# Patient Record
Sex: Male | Born: 1985 | Race: Black or African American | Hispanic: No | Marital: Single | Smoking: Current every day smoker
Health system: Southern US, Community
[De-identification: ages and names within clinical notes are randomized; demographics above are authoritative.]

## PROBLEM LIST (undated history)

## (undated) DIAGNOSIS — F1290 Cannabis use, unspecified, uncomplicated: Secondary | ICD-10-CM

## (undated) DIAGNOSIS — Z720 Tobacco use: Secondary | ICD-10-CM

---

## 2015-09-17 ENCOUNTER — Emergency Department
Admission: EM | Admit: 2015-09-17 | Discharge: 2015-09-17 | Disposition: A | Discharge status: Home / Self Care | Payer: Self-pay | Attending: Emergency Medicine | Admitting: Emergency Medicine

## 2015-09-17 ENCOUNTER — Emergency Department: Payer: Self-pay

## 2015-09-17 DIAGNOSIS — Y9389 Activity, other specified: Secondary | ICD-10-CM | POA: Insufficient documentation

## 2015-09-17 DIAGNOSIS — W228XXA Striking against or struck by other objects, initial encounter: Secondary | ICD-10-CM | POA: Insufficient documentation

## 2015-09-17 DIAGNOSIS — F17210 Nicotine dependence, cigarettes, uncomplicated: Secondary | ICD-10-CM | POA: Insufficient documentation

## 2015-09-17 DIAGNOSIS — Y9289 Other specified places as the place of occurrence of the external cause: Secondary | ICD-10-CM | POA: Insufficient documentation

## 2015-09-17 DIAGNOSIS — S60221A Contusion of right hand, initial encounter: Secondary | ICD-10-CM | POA: Insufficient documentation

## 2015-09-17 DIAGNOSIS — Y998 Other external cause status: Secondary | ICD-10-CM | POA: Insufficient documentation

## 2015-09-17 MED ORDER — NAPROXEN 500 MG PO TABS: 500.0000 mg | ORAL_TABLET | Freq: Two times a day (BID) | ORAL | Status: DC

## 2015-09-17 NOTE — Discharge Instructions (Signed)
Hand Contusion  A hand contusion is a deep bruise on your hand area. Contusions are the result of an injury that caused bleeding under the skin. The contusion may turn blue, purple, or yellow. Minor injuries will give you a painless contusion, but more severe contusions may stay painful and swollen for a few weeks.  CAUSES   A contusion is usually caused by a blow, trauma, or direct force to an area of the body.  SYMPTOMS    Swelling and redness of the injured area.   Discoloration of the injured area.   Tenderness and soreness of the injured area.   Pain.  DIAGNOSIS   The diagnosis can be made by taking a history and performing a physical exam. An X-ray, CT scan, or MRI may be needed to determine if there were any associated injuries, such as broken bones (fractures).  TREATMENT   Often, the best treatment for a hand contusion is resting, elevating, icing, and applying cold compresses to the injured area. Over-the-counter medicines may also be recommended for pain control.  HOME CARE INSTRUCTIONS    Put ice on the injured area.    Put ice in a plastic bag.    Place a towel between your skin and the bag.    Leave the ice on for 15-20 minutes, 03-04 times a day.   Only take over-the-counter or prescription medicines as directed by your caregiver. Your caregiver may recommend avoiding anti-inflammatory medicines (aspirin, ibuprofen, and naproxen) for 48 hours because these medicines may increase bruising.   If told, use an elastic wrap as directed. This can help reduce swelling. You may remove the wrap for sleeping, showering, and bathing. If your fingers become numb, cold, or blue, take the wrap off and reapply it more loosely.   Elevate your hand with pillows to reduce swelling.   Avoid overusing your hand if it is painful.  SEEK IMMEDIATE MEDICAL CARE IF:    You have increased redness, swelling, or pain in your hand.   Your swelling or pain is not relieved with medicines.   You have loss of feeling in  your hand or are unable to move your fingers.   Your hand turns cold or blue.   You have pain when you move your fingers.   Your hand becomes warm to the touch.   Your contusion does not improve in 2 days.  MAKE SURE YOU:    Understand these instructions.   Will watch your condition.   Will get help right away if you are not doing well or get worse.     This information is not intended to replace advice given to you by your health care provider. Make sure you discuss any questions you have with your health care provider.     Document Released: 02/04/2002 Document Revised: 05/09/2012 Document Reviewed: 02/06/2012  Elsevier Interactive Patient Education 2016 Elsevier [employer]

## 2015-09-17 NOTE — ED Notes (Signed)
Pt in xray
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
## 2015-09-17 NOTE — ED Notes (Signed)
Pt discharged home after verbalizing understanding of discharge instructions; nad noted. 
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
## 2015-10-26 ENCOUNTER — Emergency Department
Admission: EM | Admit: 2015-10-26 | Discharge: 2015-10-26 | Disposition: A | Discharge status: Home / Self Care | Payer: Self-pay | Attending: Emergency Medicine | Admitting: Emergency Medicine

## 2015-10-26 ENCOUNTER — Encounter: Payer: Self-pay | Admitting: Emergency Medicine

## 2015-10-26 DIAGNOSIS — S0592XA Unspecified injury of left eye and orbit, initial encounter: Secondary | ICD-10-CM | POA: Insufficient documentation

## 2015-10-26 DIAGNOSIS — Y9389 Activity, other specified: Secondary | ICD-10-CM | POA: Insufficient documentation

## 2015-10-26 DIAGNOSIS — Y998 Other external cause status: Secondary | ICD-10-CM | POA: Insufficient documentation

## 2015-10-26 DIAGNOSIS — H5712 Ocular pain, left eye: Secondary | ICD-10-CM

## 2015-10-26 DIAGNOSIS — Y9289 Other specified places as the place of occurrence of the external cause: Secondary | ICD-10-CM | POA: Insufficient documentation

## 2015-10-26 DIAGNOSIS — F17210 Nicotine dependence, cigarettes, uncomplicated: Secondary | ICD-10-CM | POA: Insufficient documentation

## 2015-10-26 DIAGNOSIS — Z791 Long term (current) use of non-steroidal anti-inflammatories (NSAID): Secondary | ICD-10-CM | POA: Insufficient documentation

## 2015-10-26 DIAGNOSIS — W2209XA Striking against other stationary object, initial encounter: Secondary | ICD-10-CM | POA: Insufficient documentation

## 2015-10-26 MED ORDER — FLUORESCEIN SODIUM 1 MG OP STRP
ORAL_STRIP | OPHTHALMIC | Status: AC
  Filled 2015-10-26: qty 1

## 2015-10-26 MED ORDER — TETRACAINE HCL 0.5 % OP SOLN
OPHTHALMIC | Status: AC
  Filled 2015-10-26: qty 2

## 2015-10-26 NOTE — Discharge Instructions (Signed)
Pain Without a Known Cause °WHAT IS PAIN WITHOUT A KNOWN CAUSE? °Pain can occur in any part of the body and can range from mild to severe. Sometimes no cause can be found for why you are having pain. Some types of pain that can occur without a known cause include:  °· Headache. °· Back pain. °· Abdominal pain. °· Neck pain. °HOW IS PAIN WITHOUT A KNOWN CAUSE DIAGNOSED?  °Your health care provider will try to find the cause of your pain. This may include: °· Physical exam. °· Medical history. °· Blood tests. °· Urine tests. °· X-rays. °If no cause is found, your health care provider may diagnose you with pain without a known cause.  °IS THERE TREATMENT FOR PAIN WITHOUT A CAUSE?  °Treatment depends on the kind of pain you have. Your health care provider may prescribe medicines to help relieve your pain.  °WHAT CAN I DO AT HOME FOR MY PAIN?  °· Take medicines only as directed by your health care provider. °· Stop any activities that cause pain. During periods of severe pain, bed rest may help. °· Try to reduce your stress with activities such as yoga or meditation. Talk to your health care provider for other stress-reducing activity recommendations. °· Exercise regularly, if approved by your health care provider. °· Eat a healthy diet that includes fruits and vegetables. This may improve pain. Talk to your health care provider if you have any questions about your diet. °WHAT IF MY PAIN DOES NOT GET BETTER?  °If you have a painful condition and no reason can be found for the pain or the pain gets worse, it is important to follow up with your health care provider. It may be necessary to repeat tests and look further for a possible cause.  °  °This information is not intended to replace advice given to you by your health care provider. Make sure you discuss any questions you have with your health care provider. °  °Document Released: 05/10/2001 Document Revised: 09/05/2014 Document Reviewed: 12/31/2013 °Elsevier  Interactive Patient Education ©2016 Elsevier Inc. ° °
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
## 2016-08-13 ENCOUNTER — Emergency Department
Admission: EM | Admit: 2016-08-13 | Discharge: 2016-08-13 | Disposition: A | Discharge status: Home / Self Care | Payer: Self-pay | Attending: Emergency Medicine | Admitting: Emergency Medicine

## 2016-08-13 DIAGNOSIS — J111 Influenza due to unidentified influenza virus with other respiratory manifestations: Secondary | ICD-10-CM

## 2016-08-13 DIAGNOSIS — F17210 Nicotine dependence, cigarettes, uncomplicated: Secondary | ICD-10-CM | POA: Insufficient documentation

## 2016-08-13 DIAGNOSIS — J09X2 Influenza due to identified novel influenza A virus with other respiratory manifestations: Secondary | ICD-10-CM | POA: Insufficient documentation

## 2016-08-13 DIAGNOSIS — Z791 Long term (current) use of non-steroidal anti-inflammatories (NSAID): Secondary | ICD-10-CM | POA: Insufficient documentation

## 2016-08-13 DIAGNOSIS — M545 Low back pain: Secondary | ICD-10-CM | POA: Insufficient documentation

## 2016-08-13 LAB — INFLUENZA PANEL BY PCR (TYPE A & B)
INFLAPCR: POSITIVE — AB
INFLBPCR: NEGATIVE

## 2016-08-13 MED ORDER — ACETAMINOPHEN 325 MG PO TABS
650.0000 mg | ORAL_TABLET | Freq: Once | ORAL | Status: AC
  Administered 2016-08-13: 650 mg via ORAL
  Filled 2016-08-13: qty 2

## 2016-08-13 MED ORDER — OSELTAMIVIR PHOSPHATE 75 MG PO CAPS
75.0000 mg | ORAL_CAPSULE | Freq: Two times a day (BID) | ORAL | 0 refills | Status: AC
Start: 2016-08-13 — End: 2016-08-23

## 2016-08-13 NOTE — ED Provider Notes (Signed)
[facility] Emergency Department Provider Note  ____________________________________________  Time seen: Approximately 1:55 PM  I have reviewed the triage vital signs and the nursing notes.   HISTORY  Chief Complaint Generalized Body Aches    HPI Charles Montes is a 30 y.o. male that presents to the emergency department with one day of fever, runny nose, body aches. Patient has not taken any medication. Patient denies cough, sore throat, shortness of breath, chest pain, abdominal pain, urinary symptoms.   History reviewed. No pertinent past medical history.  There are no active problems to display for this patient.   History reviewed. No pertinent surgical history.  Prior to Admission medications   Medication Sig Start Date End Date Taking? Authorizing Provider  naproxen (NAPROSYN) 500 MG tablet Take 1 tablet (500 mg total) by mouth 2 (two) times daily with a meal. 09/17/15   Delorise RoyalsJonathan D Cuthriell, PA-C  oseltamivir (TAMIFLU) 75 MG capsule Take 1 capsule (75 mg total) by mouth 2 (two) times daily. 08/13/16 08/23/16  Enid DerryAshley Earlee Herald, PA-C    Allergies Patient has no known allergies.  No family history on file.  Social History Social History  Substance Use Topics  . Smoking status: Current Every Day Smoker    Packs/day: 1.00    Types: Cigarettes  . Smokeless tobacco: Never Used  . Alcohol use Yes     Review of Systems  Eyes: No discharge. ENT: Negative for congestion and positive for rhinorrhea. Cardiovascular: No chest pain. Respiratory: Negative for cough. No SOB. Gastrointestinal: No abdominal pain.  No nausea, no vomiting.  No diarrhea.  No constipation. Musculoskeletal: Positive for musculoskeletal pain. Positive for low back pain. Skin: Negative for rash, abrasions, lacerations, ecchymosis. Neurological: Positive for headaches.   ____________________________________________   PHYSICAL EXAM:  VITAL SIGNS: ED Triage Vitals  [08/13/16 1328]  Enc Vitals Group     BP 134/68     Pulse Rate 95     Resp 20     Temp (!) 102 F (38.9 C)     Temp Source Oral     SpO2 100 %     Weight 180 lb (81.6 kg)     Height      Head Circumference      Peak Flow      Pain Score 8     Pain Loc      Pain Edu?      Excl. in GC?      Constitutional: Alert and oriented. Well appearing and in no acute distress. Eyes: Conjunctivae are normal. PERRL. EOMI. No discharge. Head: Atraumatic. ENT: No frontal and maxillary sinus tenderness.      Ears: Tympanic membranes pearly gray with good landmarks. No discharge.      Nose: No congestion/rhinnorhea.      Mouth/Throat: Mucous membranes are moist. Oropharynx non-erythematous. Tonsils not enlarged. No exudates. Uvula midline. Neck: No stridor.   Hematological/Lymphatic/Immunilogical: No cervical lymphadenopathy. Cardiovascular: Normal rate, regular rhythm. Normal S1 and S2.  Good peripheral circulation. Respiratory: Normal respiratory effort without tachypnea or retractions. Lungs CTAB. Good air entry to the bases with no decreased or absent breath sounds. Gastrointestinal: Bowel sounds 4 quadrants. Soft and nontender to palpation. No guarding or rigidity. No palpable masses. No distention. Musculoskeletal: Full range of motion to all extremities. No gross deformities appreciated. Neurologic:  Normal speech and language. No gross focal neurologic deficits are appreciated.  Skin:  Skin is warm, dry and intact. No rash noted. Psychiatric: Mood and affect are normal.  Speech and behavior are normal. Patient exhibits appropriate insight and judgement.   ____________________________________________   LABS (all labs ordered are listed, but only abnormal results are displayed)  Labs Reviewed  INFLUENZA PANEL BY PCR (TYPE A & B, H1N1) - Abnormal; Notable for the following:       Result Value   Influenza A By PCR POSITIVE (*)    All other components within normal limits    ____________________________________________  EKG   ____________________________________________  RADIOLOGY  No results found.  ____________________________________________    PROCEDURES  Procedure(s) performed:    Procedures    Medications  acetaminophen (TYLENOL) tablet 650 mg (650 mg Oral Given 08/13/16 1347)     ____________________________________________   INITIAL IMPRESSION / ASSESSMENT AND PLAN / ED COURSE  Pertinent labs & imaging results that were available during my care of the patient were reviewed by me and considered in my medical decision making (see chart for details).  Review of the [place] CSRS was performed in accordance of the [facility] prior to dispensing any controlled drugs.  Clinical Course     Patient's diagnosis is consistent with Influenza type A. Patient was given Tylenol in ED for fever. Fever came down before leaving ED. Patient will be discharged home with prescriptions for Tamiflu. Patient is to follow up with PCP as needed or otherwise directed. Patient is given ED precautions to return to the ED for any worsening or new symptoms.     ____________________________________________  FINAL CLINICAL IMPRESSION(S) / ED DIAGNOSES  Final diagnoses:  Influenza      NEW MEDICATIONS STARTED DURING THIS VISIT:  New Prescriptions   OSELTAMIVIR (TAMIFLU) 75 MG CAPSULE    Take 1 capsule (75 mg total) by mouth 2 (two) times daily.        This chart was dictated using voice recognition software/Dragon. Despite best efforts to proofread, errors can occur which can change the meaning. Any change was purely unintentional.    Enid DerryAshley Latroya Ng, PA-C 08/13/16 1858    Jeanmarie PlantJames A McShane, MD 08/15/16 [id number]

## 2016-08-13 NOTE — ED Triage Notes (Signed)
Pt reports to ED w/ c/o gen body aches, fever, runny nose and h/a.  Pt sts began last night, denies taking medication.  NAD.

## 2017-07-17 ENCOUNTER — Other Ambulatory Visit: Payer: Self-pay

## 2017-07-17 ENCOUNTER — Encounter: Payer: Self-pay | Admitting: Emergency Medicine

## 2017-07-17 ENCOUNTER — Emergency Department
Admission: EM | Admit: 2017-07-17 | Discharge: 2017-07-17 | Disposition: A | Discharge status: Home / Self Care | Payer: Self-pay | Attending: Emergency Medicine | Admitting: Emergency Medicine

## 2017-07-17 DIAGNOSIS — F17210 Nicotine dependence, cigarettes, uncomplicated: Secondary | ICD-10-CM | POA: Insufficient documentation

## 2017-07-17 DIAGNOSIS — J111 Influenza due to unidentified influenza virus with other respiratory manifestations: Secondary | ICD-10-CM | POA: Insufficient documentation

## 2017-07-17 LAB — INFLUENZA PANEL BY PCR (TYPE A & B)
INFLAPCR: NEGATIVE
INFLBPCR: NEGATIVE

## 2017-07-17 LAB — POCT RAPID STREP A: STREPTOCOCCUS, GROUP A SCREEN (DIRECT): NEGATIVE

## 2017-07-17 MED ORDER — KETOROLAC TROMETHAMINE 60 MG/2ML IM SOLN
60.0000 mg | Freq: Once | INTRAMUSCULAR | Status: AC
  Administered 2017-07-17: 60 mg via INTRAMUSCULAR
  Filled 2017-07-17: qty 2

## 2017-07-17 MED ORDER — OSELTAMIVIR PHOSPHATE 75 MG PO CAPS: 75.0000 mg | ORAL_CAPSULE | Freq: Two times a day (BID) | ORAL | 0 refills | Status: AC

## 2017-07-17 MED ORDER — PSEUDOEPH-BROMPHEN-DM 30-2-10 MG/5ML PO SYRP: 5.0000 mL | ORAL_SOLUTION | Freq: Four times a day (QID) | ORAL | 0 refills | Status: DC | PRN

## 2017-07-17 MED ORDER — IBUPROFEN 400 MG PO TABS
400.0000 mg | ORAL_TABLET | Freq: Once | ORAL | Status: AC
  Administered 2017-07-17: 400 mg via ORAL
  Filled 2017-07-17: qty 1

## 2017-07-17 MED ORDER — IBUPROFEN 600 MG PO TABS: 600.0000 mg | ORAL_TABLET | Freq: Four times a day (QID) | ORAL | 0 refills | Status: DC | PRN

## 2017-07-17 NOTE — ED Provider Notes (Addendum)
[facility] Emergency Department Provider Note   ____________________________________________   First MD Initiated Contact with Patient 07/17/17 1304     (approximate)  I have reviewed the triage vital signs and the nursing notes.   HISTORY  Chief Complaint Cough and Sore Throat    HPI Charles Montes is a 31 y.o. male patient presents with cough, sore throat, and body aches began last night. Patient is febrile in triage was given 2 Tylenols. Patient state he has not been around anyone else this been sick. Patient has not taken a flu shot this season.Patient denies nausea, vomiting, diarrhea. Patient rates his pain as a 9/10. Patient describes pain as "achy". No palliative measures until arrival in triage.   History reviewed. No pertinent past medical history.  There are no active problems to display for this patient.   History reviewed. No pertinent surgical history.  Prior to Admission medications   Medication Sig Start Date End Date Taking? Authorizing Provider  brompheniramine-pseudoephedrine-DM 30-2-10 MG/5ML syrup Take 5 mLs 4 (four) times daily as needed by mouth. 07/17/17   Joni ReiningSmith, Izayiah Tibbitts K, PA-C  ibuprofen (ADVIL,MOTRIN) 600 MG tablet Take 1 tablet (600 mg total) every 6 (six) hours as needed by mouth. 07/17/17   Joni ReiningSmith, Laverna Dossett K, PA-C  naproxen (NAPROSYN) 500 MG tablet Take 1 tablet (500 mg total) by mouth 2 (two) times daily with a meal. 09/17/15   Cuthriell, Delorise RoyalsJonathan D, PA-C  oseltamivir (TAMIFLU) 75 MG capsule Take 1 capsule (75 mg total) 2 (two) times daily for 5 days by mouth. 07/17/17 07/22/17  Joni ReiningSmith, Noha Karasik K, PA-C    Allergies Patient has no known allergies.  No family history on file.  Social History Social History   Tobacco Use  . Smoking status: Current Every Day Smoker    Packs/day: 1.00    Types: Cigarettes  . Smokeless tobacco: Never Used  Substance Use Topics  . Alcohol use: Yes  . Drug use: Not on file     Review of Systems Constitutional: Fever, chills, body aches  Eyes: No visual changes. ENT: Sore throat  Cardiovascular: Denies chest pain. Respiratory: Denies shortness of breath. Gastrointestinal: No abdominal pain.  No nausea, no vomiting.  No diarrhea.  No constipation. Genitourinary: Negative for dysuria. Musculoskeletal: Negative for back pain. Skin: Negative for rash. Neurological: Negative for headaches, focal weakness or numbness.   ____________________________________________   PHYSICAL EXAM:  VITAL SIGNS: ED Triage Vitals  Enc Vitals Group     BP 07/17/17 1222 128/90     Pulse Rate 07/17/17 1222 96     Resp 07/17/17 1222 18     Temp 07/17/17 1222 (!) 102 F (38.9 C)     Temp Source 07/17/17 1222 Oral     SpO2 07/17/17 1222 97 %     Weight 07/17/17 1222 180 lb (81.6 kg)     Height 07/17/17 1222 5\' 7"  (1.702 m)     Head Circumference --      Peak Flow --      Pain Score 07/17/17 1221 9     Pain Loc --      Pain Edu? --      Excl. in GC? --     Constitutional: Alert and oriented. Appears malaise Eyes: Conjunctivae are normal. PERRL. EOMI. Head: Atraumatic. Nose: Edematous nasal terms clear rhinorrhea Mouth/Throat: Mucous membranes are moist.  Oropharynx erythematous. Tonsil non-exudative or edematous. Postnasal drainage Neck: No stridor. No cervical spine tenderness to palpation. Cardiovascular: Normal rate, regular  rhythm. Grossly normal heart sounds.  Good peripheral circulation. Respiratory: Normal respiratory effort.  No retractions. Lungs CTAB. Gastrointestinal: Soft and nontender. No distention. No abdominal bruits. No CVA tenderness. Musculoskeletal: No lower extremity tenderness nor edema.  No joint effusions. Neurologic:  Normal speech and language. No gross focal neurologic deficits are appreciated. No gait instability. Skin:  Skin is warm, dry and intact. No rash noted. Psychiatric: Mood and affect are normal. Speech and behavior are  normal.  ____________________________________________   LABS (all labs ordered are listed, but only abnormal results are displayed)  Labs Reviewed  INFLUENZA PANEL BY PCR (TYPE A & B)  POCT RAPID STREP A   ____________________________________________  EKG   ____________________________________________  RADIOLOGY  No results found.  ____________________________________________   PROCEDURES  Procedure(s) performed: None  Procedures  Critical Care performed: No  ____________________________________________   INITIAL IMPRESSION / ASSESSMENT AND PLAN / ED COURSE  As part of my medical decision making, I reviewed the following data within the electronic MEDICAL RECORD NUMBER    Patient presented with acute onset of cough, sore throat, body aches, fever and chills. Patient complaint consist for flulike illness. Patient given discharge care instruction past take medication as directed.    ____________________________________________   FINAL CLINICAL IMPRESSION(S) / ED DIAGNOSES  Final diagnoses:  Influenza A     ED Discharge Orders        Ordered    oseltamivir (TAMIFLU) 75 MG capsule  2 times daily     07/17/17 1406    ibuprofen (ADVIL,MOTRIN) 600 MG tablet  Every 6 hours PRN     07/17/17 1406    brompheniramine-pseudoephedrine-DM 30-2-10 MG/5ML syrup  4 times daily PRN     07/17/17 1406       Note:  This document was prepared using Dragon voice recognition software and may include unintentional dictation errors.    Joni ReiningSmith, Krzysztof Reichelt K, PA-C 07/17/17 1410    Emily FilbertWilliams, Jonathan E, MD 07/17/17 1415    Joni ReiningSmith, Alene Bergerson K, PA-C 07/17/17 1421    Emily FilbertWilliams, Jonathan E, MD 07/17/17 [phone]

## 2017-07-17 NOTE — ED Triage Notes (Signed)
Cough and sore throat since last night, pt sweating in triage, took 2 tylenol this am.

## 2017-07-17 NOTE — ED Notes (Signed)
E-signature box not working. Pt verbalized understanding of discharge instructions and denied questions. 

## 2017-07-19 ENCOUNTER — Emergency Department
Admission: EM | Admit: 2017-07-19 | Discharge: 2017-07-19 | Disposition: A | Discharge status: Home / Self Care | Payer: Self-pay | Attending: Emergency Medicine | Admitting: Emergency Medicine

## 2017-07-19 ENCOUNTER — Encounter: Payer: Self-pay | Admitting: Emergency Medicine

## 2017-07-19 DIAGNOSIS — Z79899 Other long term (current) drug therapy: Secondary | ICD-10-CM | POA: Insufficient documentation

## 2017-07-19 DIAGNOSIS — F17210 Nicotine dependence, cigarettes, uncomplicated: Secondary | ICD-10-CM | POA: Insufficient documentation

## 2017-07-19 DIAGNOSIS — J039 Acute tonsillitis, unspecified: Secondary | ICD-10-CM | POA: Insufficient documentation

## 2017-07-19 LAB — CBC WITH DIFFERENTIAL/PLATELET
BASOS PCT: 0 %
Basophils Absolute: 0 10*3/uL (ref 0–0.1)
Eosinophils Absolute: 0.1 10*3/uL (ref 0–0.7)
Eosinophils Relative: 1 %
HEMATOCRIT: 45.8 % (ref 40.0–52.0)
HEMOGLOBIN: 15 g/dL (ref 13.0–18.0)
LYMPHS PCT: 11 %
Lymphs Abs: 1.6 10*3/uL (ref 1.0–3.6)
MCH: 31.4 pg (ref 26.0–34.0)
MCHC: 32.7 g/dL (ref 32.0–36.0)
MCV: 95.9 fL (ref 80.0–100.0)
MONO ABS: 2.5 10*3/uL — AB (ref 0.2–1.0)
Monocytes Relative: 17 %
NEUTROS PCT: 71 %
Neutro Abs: 10.3 10*3/uL — ABNORMAL HIGH (ref 1.4–6.5)
Platelets: 220 10*3/uL (ref 150–440)
RBC: 4.77 MIL/uL (ref 4.40–5.90)
RDW: 13.3 % (ref 11.5–14.5)
WBC: 14.5 10*3/uL — ABNORMAL HIGH (ref 3.8–10.6)

## 2017-07-19 LAB — BASIC METABOLIC PANEL
Anion gap: 13 (ref 5–15)
BUN: 11 mg/dL (ref 6–20)
CHLORIDE: 99 mmol/L — AB (ref 101–111)
CO2: 25 mmol/L (ref 22–32)
CREATININE: 1.47 mg/dL — AB (ref 0.61–1.24)
Calcium: 9.3 mg/dL (ref 8.9–10.3)
GFR calc non Af Amer: >60 mL/min (ref >60)
GLUCOSE: 101 mg/dL — AB (ref 65–99)
Potassium: 4.1 mmol/L (ref 3.5–5.1)
Sodium: 137 mmol/L (ref 135–145)

## 2017-07-19 LAB — POCT RAPID STREP A: Streptococcus, Group A Screen (Direct): NEGATIVE

## 2017-07-19 MED ORDER — SODIUM CHLORIDE 0.9 % IV BOLUS (SEPSIS)
1000.0000 mL | Freq: Once | INTRAVENOUS | Status: AC
  Administered 2017-07-19: 1000 mL via INTRAVENOUS

## 2017-07-19 MED ORDER — AMOXICILLIN-POT CLAVULANATE 250-62.5 MG/5ML PO SUSR: 875.0000 mg | Freq: Two times a day (BID) | ORAL | 0 refills | Status: AC

## 2017-07-19 MED ORDER — PREDNISOLONE SODIUM PHOSPHATE 15 MG/5ML PO SOLN: 50.0000 mg | Freq: Every day | ORAL | 0 refills | Status: AC

## 2017-07-19 MED ORDER — HYDROCODONE-ACETAMINOPHEN 7.5-325 MG/15ML PO SOLN: 15.0000 mL | Freq: Four times a day (QID) | ORAL | 0 refills | Status: AC | PRN

## 2017-07-19 MED ORDER — AMPICILLIN-SULBACTAM SODIUM 3 (2-1) G IJ SOLR
3.0000 g | Freq: Once | INTRAMUSCULAR | Status: AC
  Administered 2017-07-19: 3 g via INTRAVENOUS
  Filled 2017-07-19: qty 3

## 2017-07-19 MED ORDER — LIDOCAINE VISCOUS 2 % MT SOLN
15.0000 mL | Freq: Once | OROMUCOSAL | Status: AC
  Administered 2017-07-19: 15 mL via OROMUCOSAL
  Filled 2017-07-19: qty 15

## 2017-07-19 MED ORDER — METHYLPREDNISOLONE SODIUM SUCC 125 MG IJ SOLR
125.0000 mg | Freq: Once | INTRAMUSCULAR | Status: AC
  Administered 2017-07-19: 125 mg via INTRAVENOUS
  Filled 2017-07-19: qty 2

## 2017-07-19 NOTE — Discharge Instructions (Signed)
Return to the ER for symptoms that worsen. Follow up with the ENT specialist. Make sure you take all of the antibiotic and steroid medications.  Take your antibiotic at 6pm this evening

## 2017-07-19 NOTE — ED Triage Notes (Signed)
Pt reports just had the flu and now has severe throat pain. Pt unable to speak clearly, reports pain for the past 3 days. Pt also reports some nasal congestion as well but reports his throat is so painful he is having difficult eating and drinking.

## 2017-07-19 NOTE — ED Provider Notes (Signed)
[facility] Emergency Department Provider Note  ____________________________________________  Time seen: Approximately 10:36 AM  I have reviewed the triage vital signs and the nursing notes.   HISTORY  Chief Complaint Sore Throat    HPI Charles Montes is a 31 y.o. male who presents to the emergency department for treatment and evaluation of sore throat.  He was evaluated here 2 days ago and given prescriptions for Tamiflu, ibuprofen, and Bromfed.  He states that over the past 3 days his throat has began to hurt much worse and he is now having difficulty eating, drinking, and swallowing due to the pain.  He has had no relief from the above medications.  He has no known drug allergies, he has no known chronic health issues and other than the previous prescriptions from 2 days ago, he takes no daily medications.  Positive History reviewed. No pertinent past medical history.  There are no active problems to display for this patient.   History reviewed. No pertinent surgical history.  Prior to Admission medications   Medication Sig Start Date End Date Taking? Authorizing Provider  amoxicillin-clavulanate (AUGMENTIN) 250-62.5 MG/5ML suspension Take 17.5 mLs (875 mg total) by mouth 2 (two) times daily for 10 days. 07/19/17 07/29/17  Chinita Pesterriplett, Demyah Smyre B, FNP  brompheniramine-pseudoephedrine-DM 30-2-10 MG/5ML syrup Take 5 mLs 4 (four) times daily as needed by mouth. 07/17/17   Joni ReiningSmith, Ronald K, PA-C  HYDROcodone-acetaminophen (HYCET) 7.5-325 mg/15 ml solution Take 15 mLs by mouth 4 (four) times daily as needed for moderate pain. 07/19/17 07/19/18  Arrian Manson, Kasandra Knudsenari B, FNP  ibuprofen (ADVIL,MOTRIN) 600 MG tablet Take 1 tablet (600 mg total) every 6 (six) hours as needed by mouth. 07/17/17   Joni ReiningSmith, Ronald K, PA-C  naproxen (NAPROSYN) 500 MG tablet Take 1 tablet (500 mg total) by mouth 2 (two) times daily with a meal. 09/17/15   Cuthriell, Delorise RoyalsJonathan D, PA-C  oseltamivir  (TAMIFLU) 75 MG capsule Take 1 capsule (75 mg total) 2 (two) times daily for 5 days by mouth. 07/17/17 07/22/17  Joni ReiningSmith, Ronald K, PA-C  prednisoLONE (ORAPRED) 15 MG/5ML solution Take 16.7 mLs (50 mg total) by mouth daily for 5 days. 07/19/17 07/24/17  Chinita Pesterriplett, Glori Machnik B, FNP    Allergies Patient has no known allergies.  No family history on file.  Social History Social History   Tobacco Use  . Smoking status: Current Every Day Smoker    Packs/day: 1.00    Types: Cigarettes  . Smokeless tobacco: Never Used  Substance Use Topics  . Alcohol use: Yes  . Drug use: Not on file    Review of Systems Constitutional: Positive for fever. Eyes: No visual changes. ENT: Positive for sore throat; positive for difficulty swallowing. Respiratory: Denies shortness of breath. Gastrointestinal: No abdominal pain.  No nausea, no vomiting.  No diarrhea.  Genitourinary: Negative for dysuria. Musculoskeletal: Positive for generalized body aches. Skin: Negative for rash. Neurological: Negative for headaches, negative focal weakness or numbness.  ____________________________________________   PHYSICAL EXAM:  VITAL SIGNS: ED Triage Vitals  Enc Vitals Group     BP 07/19/17 0956 (!) 150/102     Pulse Rate 07/19/17 0956 100     Resp 07/19/17 0956 20     Temp 07/19/17 0956 99.7 F (37.6 C)     Temp src --      SpO2 07/19/17 0956 100 %     Weight 07/19/17 0956 180 lb (81.6 kg)     Height 07/19/17 0956 5\' 7"  (1.702 m)  Head Circumference --      Peak Flow --      Pain Score 07/19/17 0955 10     Pain Loc --      Pain Edu? --      Excl. in GC? --    Constitutional: Alert and oriented. Well appearing and in no acute distress. Eyes: Conjunctivae are normal.  Head: Atraumatic. Nose: No congestion/rhinnorhea. Mouth/Throat: Mucous membranes are moist.  Oropharynx erythematous, tonsils 3+ with exudate. Uvula is midline. Neck: No stridor.  Lymphatic: Lymphadenopathy: Bilateral anterior cervical  lymphadenopathy palpable Cardiovascular: Tachycardic, regular rhythm. Good peripheral circulation. Respiratory: Normal respiratory effort. Lungs CTAB. Gastrointestinal: Soft and nontender. Musculoskeletal: No lower extremity tenderness nor edema.  Neurologic:  Normal speech and language. No gross focal neurologic deficits are appreciated. Speech is normal. No gait instability. Skin:  Skin is warm, dry and intact. No rash noted Psychiatric: Mood and affect are normal. Speech and behavior are normal.  ____________________________________________   LABS (all labs ordered are listed, but only abnormal results are displayed)  Labs Reviewed  BASIC METABOLIC PANEL - Abnormal; Notable for the following components:      Result Value   Chloride 99 (*)    Glucose, Bld 101 (*)    Creatinine, Ser 1.47 (*)    All other components within normal limits  CBC WITH DIFFERENTIAL/PLATELET - Abnormal; Notable for the following components:   WBC 14.5 (*)    Neutro Abs 10.3 (*)    Monocytes Absolute 2.5 (*)    All other components within normal limits  POCT RAPID STREP A   ____________________________________________  EKG  Not indicated ____________________________________________  RADIOLOGY  Not indicated ____________________________________________   PROCEDURES  Procedure(s) performed: None  Critical Care performed: No ____________________________________________   INITIAL IMPRESSION / ASSESSMENT AND PLAN / ED COURSE  31 year old male presenting to the emergency department for treatment and evaluation of sore throat.  Voice is slightly muffled, but there is no swelling of the tongue and uvula is midline.  He will be given Solu-Medrol, Unasyn, and IV fluids.  Labs have also been ordered.  This is most likely a tonsillitis and not peritonsillar abscess based on exam.  Imaging is not indicated at this time.  ----------------------------------------- 12:49 PM on  07/19/2017 -----------------------------------------  After IV fluids and Solu-Medrol, patient was able to speak much more clearly and swallow without difficulty.  Home care instructions were discussed with him and his significant other.  He was advised to follow-up with ENT if not improving over the weekend.  He was given strict ER return precautions as well.  Pertinent labs & imaging results that were available during my care of the patient were reviewed by me and considered in my medical decision making (see chart for details). ____________________________________________  FINAL CLINICAL IMPRESSION(S) / ED DIAGNOSES  Final diagnoses:  Tonsillitis with exudate    If controlled substance prescribed during this visit, 12 month history viewed on the [facility] prior to issuing an initial prescription for Schedule II or III opiod.   Note:  This document was prepared using Dragon voice recognition software and may include unintentional dictation errors.    Chinita Pesterriplett, Orhan Mayorga B, FNP 07/19/17 1333    Nita SickleVeronese, [place], MD 07/19/17 [phone]

## 2019-07-23 ENCOUNTER — Emergency Department: Payer: No Typology Code available for payment source

## 2019-07-23 ENCOUNTER — Other Ambulatory Visit: Payer: Self-pay

## 2019-07-23 ENCOUNTER — Emergency Department
Admission: EM | Admit: 2019-07-23 | Discharge: 2019-07-23 | Disposition: A | Discharge status: Home / Self Care | Payer: No Typology Code available for payment source | Attending: Emergency Medicine | Admitting: Emergency Medicine

## 2019-07-23 ENCOUNTER — Encounter: Payer: Self-pay | Admitting: Emergency Medicine

## 2019-07-23 DIAGNOSIS — M25511 Pain in right shoulder: Secondary | ICD-10-CM | POA: Insufficient documentation

## 2019-07-23 DIAGNOSIS — F17210 Nicotine dependence, cigarettes, uncomplicated: Secondary | ICD-10-CM | POA: Diagnosis not present

## 2019-07-23 DIAGNOSIS — M545 Low back pain: Secondary | ICD-10-CM | POA: Diagnosis not present

## 2019-07-23 DIAGNOSIS — M791 Myalgia, unspecified site: Secondary | ICD-10-CM | POA: Insufficient documentation

## 2019-07-23 DIAGNOSIS — R519 Headache, unspecified: Secondary | ICD-10-CM | POA: Diagnosis present

## 2019-07-23 DIAGNOSIS — M7918 Myalgia, other site: Secondary | ICD-10-CM

## 2019-07-23 MED ORDER — IBUPROFEN 600 MG PO TABS
600.0000 mg | ORAL_TABLET | Freq: Once | ORAL | Status: AC
  Administered 2019-07-23: 600 mg via ORAL
  Filled 2019-07-23: qty 1

## 2019-07-23 MED ORDER — CYCLOBENZAPRINE HCL 10 MG PO TABS
10.0000 mg | ORAL_TABLET | Freq: Once | ORAL | Status: AC
  Administered 2019-07-23: 10 mg via ORAL
  Filled 2019-07-23: qty 1

## 2019-07-23 MED ORDER — CYCLOBENZAPRINE HCL 10 MG PO TABS: 10.0000 mg | ORAL_TABLET | Freq: Three times a day (TID) | ORAL | 0 refills | Status: DC | PRN

## 2019-07-23 MED ORDER — TRAMADOL HCL 50 MG PO TABS: 50.0000 mg | ORAL_TABLET | Freq: Four times a day (QID) | ORAL | 0 refills | Status: AC | PRN

## 2019-07-23 MED ORDER — IBUPROFEN 600 MG PO TABS: 600.0000 mg | ORAL_TABLET | Freq: Three times a day (TID) | ORAL | 0 refills | Status: DC | PRN

## 2019-07-23 MED ORDER — TRAMADOL HCL 50 MG PO TABS
50.0000 mg | ORAL_TABLET | Freq: Once | ORAL | Status: AC
  Administered 2019-07-23: 14:00:00 50 mg via ORAL
  Filled 2019-07-23: qty 1

## 2019-07-23 NOTE — Discharge Instructions (Signed)
Follow discharge care instructions take medication as directed. °
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
## 2019-07-27 ENCOUNTER — Other Ambulatory Visit: Payer: Self-pay

## 2019-07-27 ENCOUNTER — Encounter: Payer: Self-pay | Admitting: Emergency Medicine

## 2019-07-27 ENCOUNTER — Emergency Department
Admission: EM | Admit: 2019-07-27 | Discharge: 2019-07-27 | Disposition: A | Discharge status: Home / Self Care | Payer: No Typology Code available for payment source | Attending: Student | Admitting: Student

## 2019-07-27 ENCOUNTER — Emergency Department: Payer: No Typology Code available for payment source

## 2019-07-27 DIAGNOSIS — M5442 Lumbago with sciatica, left side: Secondary | ICD-10-CM | POA: Insufficient documentation

## 2019-07-27 DIAGNOSIS — F17210 Nicotine dependence, cigarettes, uncomplicated: Secondary | ICD-10-CM | POA: Diagnosis not present

## 2019-07-27 DIAGNOSIS — S0083XD Contusion of other part of head, subsequent encounter: Secondary | ICD-10-CM | POA: Diagnosis not present

## 2019-07-27 DIAGNOSIS — M25511 Pain in right shoulder: Secondary | ICD-10-CM | POA: Diagnosis present

## 2019-07-27 DIAGNOSIS — M546 Pain in thoracic spine: Secondary | ICD-10-CM | POA: Diagnosis not present

## 2019-07-27 MED ORDER — HYDROCODONE-ACETAMINOPHEN 5-325 MG PO TABS
1.0000 | ORAL_TABLET | Freq: Once | ORAL | Status: AC
  Administered 2019-07-27: 1 via ORAL
  Filled 2019-07-27: qty 1

## 2019-07-27 MED ORDER — HYDROCODONE-ACETAMINOPHEN 5-325 MG PO TABS: 1.0000 | ORAL_TABLET | Freq: Three times a day (TID) | ORAL | 0 refills | Status: AC | PRN

## 2019-07-27 NOTE — ED Notes (Signed)
Pt to xray
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
## 2020-01-11 ENCOUNTER — Other Ambulatory Visit: Payer: Self-pay

## 2020-01-11 ENCOUNTER — Emergency Department: Payer: PRIVATE HEALTH INSURANCE

## 2020-01-11 ENCOUNTER — Emergency Department
Admission: EM | Admit: 2020-01-11 | Discharge: 2020-01-11 | Disposition: A | Discharge status: Home / Self Care | Payer: Self-pay | Attending: Emergency Medicine | Admitting: Emergency Medicine

## 2020-01-11 DIAGNOSIS — R55 Syncope and collapse: Secondary | ICD-10-CM | POA: Insufficient documentation

## 2020-01-11 LAB — CBC
HCT: 44.9 % (ref 39.0–52.0)
Hemoglobin: 15.8 g/dL (ref 13.0–17.0)
MCH: 32.7 pg (ref 26.0–34.0)
MCHC: 35.2 g/dL (ref 30.0–36.0)
MCV: 93 fL (ref 80.0–100.0)
Platelets: 341 10*3/uL (ref 150–400)
RBC: 4.83 MIL/uL (ref 4.22–5.81)
RDW: 12.7 % (ref 11.5–15.5)
WBC: 12.5 10*3/uL — ABNORMAL HIGH (ref 4.0–10.5)
nRBC: 0 % (ref 0.0–0.2)

## 2020-01-11 LAB — BASIC METABOLIC PANEL
Anion gap: 10 (ref 5–15)
BUN: 8 mg/dL (ref 6–20)
CO2: 21 mmol/L — ABNORMAL LOW (ref 22–32)
Calcium: 9.3 mg/dL (ref 8.9–10.3)
Chloride: 108 mmol/L (ref 98–111)
Creatinine, Ser: 1.3 mg/dL — ABNORMAL HIGH (ref 0.61–1.24)
GFR calc Af Amer: >60 mL/min (ref >60)
GFR calc non Af Amer: >60 mL/min (ref >60)
Glucose, Bld: 98 mg/dL (ref 70–99)
Potassium: 4.2 mmol/L (ref 3.5–5.1)
Sodium: 139 mmol/L (ref 135–145)

## 2020-01-11 LAB — GLUCOSE, CAPILLARY: Glucose-Capillary: 99 mg/dL (ref 70–99)

## 2020-01-11 MED ORDER — SODIUM CHLORIDE 0.9 % IV BOLUS
1000.0000 mL | Freq: Once | INTRAVENOUS | Status: AC
  Administered 2020-01-11: 1000 mL via INTRAVENOUS

## 2020-01-11 MED ORDER — SODIUM CHLORIDE 0.9% FLUSH
3.0000 mL | Freq: Once | INTRAVENOUS | Status: AC
  Administered 2020-01-11: 3 mL via INTRAVENOUS

## 2020-01-11 NOTE — ED Notes (Signed)
Pt ambulatory with steady gait upon discharge.

## 2020-01-11 NOTE — Discharge Instructions (Signed)
Labs and CT imaging were negative.  Most likely was vasovagal syncope.  Return to the ER for any other concerns.

## 2020-01-11 NOTE — ED Triage Notes (Signed)
Pt arrives via ems from jail, pt is under arrest and once getting out of the back of the police car, pt felt hot and sweaty and sat hisself down on the ground. Pt states he felt good, yesterday and reports some strain feeling to his right hamstring.

## 2020-01-11 NOTE — ED Provider Notes (Signed)
[facility] Emergency Department Provider Note  ____________________________________________   First MD Initiated Contact with Patient 01/11/20 1145     (approximate)  I have reviewed the triage vital signs and the nursing notes.   HISTORY  Chief Complaint Near Syncope    HPI Charles Montes is a 34 y.o. male otherwise healthy who comes in for syncopal episode.  Patient was arrested by police and when they got out of the car he started feeling hot sweaty and sat back onto the ground.  According to EMS they think he might of hit his head.  They sent him here for clearance.  Patient did drink alcohol potentially use illegal substances yesterday.  He denies any chest pain, shortness of breath, abdominal pain.  He is moving all extremities well.  Syncopal episode occurred today, 1 time, felt hot and sweaty prior, nothing made it better, nothing made it worse     Medical: No prior medical history    There are no problems to display for this patient.   No past surgical history on file.  Prior to Admission medications   Medication Sig Start Date End Date Taking? Authorizing Provider  cyclobenzaprine (FLEXERIL) 10 MG tablet Take 1 tablet (10 mg total) by mouth 3 (three) times daily as needed. 07/23/19   Sable Feil, PA-C  HYDROcodone-acetaminophen (NORCO/VICODIN) 5-325 MG tablet Take 1 tablet by mouth every 8 (eight) hours as needed for moderate pain. 07/27/19 07/26/20  Jearld Fenton, NP  ibuprofen (ADVIL) 600 MG tablet Take 1 tablet (600 mg total) by mouth every 8 (eight) hours as needed. 07/23/19   Sable Feil, PA-C  traMADol (ULTRAM) 50 MG tablet Take 1 tablet (50 mg total) by mouth every 6 (six) hours as needed. 07/23/19 07/22/20  Sable Feil, PA-C    Allergies Patient has no known allergies.  No family history on file.  Social History Social History   Tobacco Use  . Smoking status: Current Every Day Smoker    Packs/day: 1.00      Types: Cigarettes  . Smokeless tobacco: Never Used  Substance Use Topics  . Alcohol use: Yes  . Drug use: Not on file      Review of Systems Constitutional: No fever/chills, syncope, feeling hot all over Eyes: No visual changes. ENT: No sore throat. Cardiovascular: Denies chest pain. Respiratory: Denies shortness of breath. Gastrointestinal: No abdominal pain.  No nausea, no vomiting.  No diarrhea.  No constipation. Genitourinary: Negative for dysuria. Musculoskeletal: Negative for back pain. Skin: Negative for rash. Neurological: Negative for headaches, focal weakness or numbness. All other ROS negative ____________________________________________   PHYSICAL EXAM:  VITAL SIGNS: ED Triage Vitals  Enc Vitals Group     BP 01/11/20 1037 130/81     Pulse Rate 01/11/20 1037 98     Resp 01/11/20 1037 18     Temp 01/11/20 1037 98.2 F (36.8 C)     Temp Source 01/11/20 1037 Oral     SpO2 01/11/20 1037 95 %     Weight 01/11/20 1038 180 lb (81.6 kg)     Height 01/11/20 1038 5\' 6"  (1.676 m)     Head Circumference --      Peak Flow --      Pain Score 01/11/20 1038 7     Pain Loc --      Pain Edu? --      Excl. in [place]? --     Constitutional: Alert and oriented. Well appearing  and in no acute distress. Eyes: Conjunctivae are normal. EOMI. Head: Atraumatic. Nose: No congestion/rhinnorhea. Mouth/Throat: Mucous membranes are moist.   Neck: No stridor. Trachea Midline. FROM Cardiovascular: Normal rate, regular rhythm. Grossly normal heart sounds.  Good peripheral circulation. Respiratory: Normal respiratory effort.  No retractions. Lungs CTAB. Gastrointestinal: Soft and nontender. No distention. No abdominal bruits.  Musculoskeletal: No lower extremity tenderness nor edema.  No joint effusions. Neurologic:  Normal speech and language. No gross focal neurologic deficits are appreciated.  Patient is moving all extremities well.  Patient reports he feels like he hurt his  hamstring but still able to lift the right leg up off the bed Skin:  Skin is warm, dry and intact. No rash noted. Psychiatric: Mood and affect are normal. Speech and behavior are normal. GU: Deferred   ____________________________________________   LABS (all labs ordered are listed, but only abnormal results are displayed)  Labs Reviewed  BASIC METABOLIC PANEL - Abnormal; Notable for the following components:      Result Value   CO2 21 (*)    Creatinine, Ser 1.30 (*)    All other components within normal limits  CBC - Abnormal; Notable for the following components:   WBC 12.5 (*)    All other components within normal limits  GLUCOSE, CAPILLARY  CBG MONITORING, ED   ____________________________________________   ED ECG REPORT I, Concha Se, the attending physician, personally viewed and interpreted this ECG.  EKG is normal sinus rate 91, no ST elevation, no T wave inversions, normal intervals ____________________________________________  RADIOLOGY  Official radiology report(s): CT Head Wo Contrast  Result Date: 01/11/2020 CLINICAL DATA:  Headache, neck pain EXAM: CT HEAD WITHOUT CONTRAST CT CERVICAL SPINE WITHOUT CONTRAST TECHNIQUE: Multidetector CT imaging of the head and cervical spine was performed following the standard protocol without intravenous contrast. Multiplanar CT image reconstructions of the cervical spine were also generated. COMPARISON:  07/23/2019 FINDINGS: CT HEAD FINDINGS Brain: No evidence of acute infarction, hemorrhage, hydrocephalus, extra-axial collection or mass lesion/mass effect. Vascular: No hyperdense vessel or unexpected calcification. Skull: Normal. Negative for fracture or focal lesion. Sinuses/Orbits: Minimal mucosal thickening in the right maxillary sinus. Paranasal sinuses and mastoid air cells are otherwise clear. Orbital structures unremarkable. Other: None. CT CERVICAL SPINE FINDINGS Alignment: Normal. Skull base and vertebrae: No acute  fracture. No primary bone lesion or focal pathologic process. Soft tissues and spinal canal: No prevertebral fluid or swelling. No visible canal hematoma. Disc levels:  Unremarkable. Upper chest: Lung apices clear. Other: None. IMPRESSION: 1. No acute intracranial abnormality. 2. No acute fracture or subluxation of the cervical spine. Electronically Signed   By: Duanne Guess D.O.   On: 01/11/2020 13:21   CT Cervical Spine Wo Contrast  Result Date: 01/11/2020 CLINICAL DATA:  Headache, neck pain EXAM: CT HEAD WITHOUT CONTRAST CT CERVICAL SPINE WITHOUT CONTRAST TECHNIQUE: Multidetector CT imaging of the head and cervical spine was performed following the standard protocol without intravenous contrast. Multiplanar CT image reconstructions of the cervical spine were also generated. COMPARISON:  07/23/2019 FINDINGS: CT HEAD FINDINGS Brain: No evidence of acute infarction, hemorrhage, hydrocephalus, extra-axial collection or mass lesion/mass effect. Vascular: No hyperdense vessel or unexpected calcification. Skull: Normal. Negative for fracture or focal lesion. Sinuses/Orbits: Minimal mucosal thickening in the right maxillary sinus. Paranasal sinuses and mastoid air cells are otherwise clear. Orbital structures unremarkable. Other: None. CT CERVICAL SPINE FINDINGS Alignment: Normal. Skull base and vertebrae: No acute fracture. No primary bone lesion or focal pathologic process. Soft tissues  and spinal canal: No prevertebral fluid or swelling. No visible canal hematoma. Disc levels:  Unremarkable. Upper chest: Lung apices clear. Other: None. IMPRESSION: 1. No acute intracranial abnormality. 2. No acute fracture or subluxation of the cervical spine. Electronically Signed   By: Duanne Guess D.O.   On: 01/11/2020 13:21    ____________________________________________   PROCEDURES  Procedure(s) performed (including Critical Care):  Procedures   ____________________________________________   INITIAL  IMPRESSION / ASSESSMENT AND PLAN / ED COURSE  Janece Canterbury Flori was evaluated in Emergency Department on 01/11/2020 for the symptoms described in the history of present illness. He was evaluated in the context of the global COVID-19 pandemic, which necessitated consideration that the patient might be at risk for infection with the SARS-CoV-2 virus that causes COVID-19. Institutional protocols and algorithms that pertain to the evaluation of patients at risk for COVID-19 are in a state of rapid change based on information released by regulatory bodies including the CDC and federal and state organizations. These policies and algorithms were followed during the patient's care in the ED.     Patient is a 34 year old who presents after being arrested for feeling sweaty and hot all over and then potential possible syncopal episode.  Unclear if he truly lost consciousness.  According to police he did not think he did but sound like he did hit his head.  Will get CT head evaluate for intracranial hemorrhage, CT cervical evaluate for cervical fracture get labs to evaluate for electrolyte abnormalities.  Given patient was drinking some alcohol will give some fluids in case this was secondary to dehydration.  Labs are reassuring.  Kidney function around baseline, CT head is negative.  Reevaluated patient updated on results.  Patient expressed understanding.  I discussed the provisional nature of ED diagnosis, the treatment so far, the ongoing plan of care, follow up appointments and return precautions with the patient and any family or support people present. They expressed understanding and agreed with the plan, discharged home.     ____________________________________________   FINAL CLINICAL IMPRESSION(S) / ED DIAGNOSES   Final diagnoses:  Near syncope  Vaso vagal episode      MEDICATIONS GIVEN DURING THIS VISIT:  Medications  sodium chloride flush (NS) 0.9 % injection 3 mL (3 mLs  Intravenous Given 01/11/20 1202)  sodium chloride 0.9 % bolus 1,000 mL (1,000 mLs Intravenous New Bag/Given 01/11/20 1202)     ED Discharge Orders    None       Note:  This document was prepared using Dragon voice recognition software and may include unintentional dictation errors.   Concha Se, MD 01/11/20 1340

## 2020-07-21 ENCOUNTER — Emergency Department
Admission: EM | Admit: 2020-07-21 | Discharge: 2020-07-21 | Disposition: A | Discharge status: Home / Self Care | Payer: PRIVATE HEALTH INSURANCE | Attending: Emergency Medicine | Admitting: Emergency Medicine

## 2020-07-21 ENCOUNTER — Other Ambulatory Visit: Payer: Self-pay

## 2020-07-21 DIAGNOSIS — M67432 Ganglion, left wrist: Secondary | ICD-10-CM | POA: Insufficient documentation

## 2020-07-21 DIAGNOSIS — M67431 Ganglion, right wrist: Secondary | ICD-10-CM | POA: Insufficient documentation

## 2020-07-21 DIAGNOSIS — F17210 Nicotine dependence, cigarettes, uncomplicated: Secondary | ICD-10-CM | POA: Insufficient documentation

## 2020-07-21 MED ORDER — MELOXICAM 15 MG PO TABS: 15.0000 mg | ORAL_TABLET | Freq: Every day | ORAL | 0 refills | Status: DC

## 2020-07-21 NOTE — ED Triage Notes (Signed)
PT to ED via POV with raised area to anterior right wrist for approx 4 months and a smaller raised area emerging to posterior L wrist. States intermittent pain. No drainage or fevers.

## 2020-07-21 NOTE — ED Provider Notes (Signed)
[facility] Emergency Department Provider Note  ____________________________________________  Time seen: Approximately 7:50 PM  I have reviewed the triage vital signs and the nursing notes.   HISTORY  Chief Complaint Abscess    HPI Charles Montes is a 34 y.o. male who presents the emergency department complaining of 2 possible abscesses, 1 to each wrist.  Patient states that he has had a bulge to the dorsal aspect of the right wrist for approximately 4 months.  Over the past week or 2 he has noticed a similar bulge on the anterior aspect of his left wrist.  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  He is now endorsing pain to both areas.  No erythema overlying the skin in these areas.  No history of recurrent abscesses.         History reviewed. No pertinent past medical history.  There are no problems to display for this patient.   History reviewed. No pertinent surgical history.  Prior to Admission medications   Medication Sig Start Date End Date Taking? Authorizing Provider  cyclobenzaprine (FLEXERIL) 10 MG tablet Take 1 tablet (10 mg total) by mouth 3 (three) times daily as needed. 07/23/19   Joni Reining, PA-C  HYDROcodone-acetaminophen (NORCO/VICODIN) 5-325 MG tablet Take 1 tablet by mouth every 8 (eight) hours as needed for moderate pain. 07/27/19 07/26/20  Lorre Munroe, NP  ibuprofen (ADVIL) 600 MG tablet Take 1 tablet (600 mg total) by mouth every 8 (eight) hours as needed. 07/23/19   Joni Reining, PA-C  meloxicam (MOBIC) 15 MG tablet Take 1 tablet (15 mg total) by mouth daily. 07/21/20   Danell Verno, Delorise Royals, PA-C  traMADol (ULTRAM) 50 MG tablet Take 1 tablet (50 mg total) by mouth every 6 (six) hours as needed. 07/23/19 07/22/20  Joni Reining, PA-C    Allergies Patient has no known allergies.  No family history on file.  Social History Social History   Tobacco Use  . Smoking status: Current Every  Day Smoker    Packs/day: 1.00    Types: Cigarettes  . Smokeless tobacco: Never Used  Substance Use Topics  . Alcohol use: Yes    Comment: weekends   . Drug use: Not Currently    Types: Marijuana     Review of Systems  Constitutional: No fever/chills Eyes: No visual changes. No discharge ENT: No upper respiratory complaints. Cardiovascular: no chest pain. Respiratory: no cough. No SOB. Gastrointestinal: No abdominal pain.  No nausea, no vomiting.  No diarrhea.  No constipation. Musculoskeletal: Bilateral bulges to both wrist. Skin: Negative for rash, abrasions, lacerations, ecchymosis. Neurological: Negative for headaches, focal weakness or numbness.  10 System ROS otherwise negative.  ____________________________________________   PHYSICAL EXAM:  VITAL SIGNS: ED Triage Vitals [07/21/20 1913]  Enc Vitals Group     BP (!) 138/92     Pulse Rate 78     Resp 18     Temp 98.3 F (36.8 C)     Temp Source Oral     SpO2 98 %     Weight 185 lb (83.9 kg)     Height 5\' 6"  (1.676 m)     Head Circumference      Peak Flow      Pain Score 0     Pain Loc      Pain Edu?      Excl. in [place]?      Constitutional: Alert and oriented. Well appearing and in no  acute distress. Eyes: Conjunctivae are normal. PERRL. EOMI. Head: Atraumatic. ENT:      Ears:       Nose: No congestion/rhinnorhea.      Mouth/Throat: Mucous membranes are moist.  Neck: No stridor.    Cardiovascular: Normal rate, regular rhythm. Normal S1 and S2.  Good peripheral circulation. Respiratory: Normal respiratory effort without tachypnea or retractions. Lungs CTAB. Good air entry to the bases with no decreased or absent breath sounds. Musculoskeletal: Full range of motion to all extremities. No gross deformities appreciated.  Visualization of the right wrist reveals an edematous lesion along the dorsal joint line consistent with ganglion cyst.  Palpation reveals well-circumscribed finding consistent with a cyst.   No overlying erythema or edema.  There is no fluctuance or induration of the skin.  Full range of motion to the wrist and all digits right hand.  Sensation capillary refill intact all digits.  Visualization of the left wrist reveals a small edematous area consistent with ganglion cyst as well.  This is along the anterior aspect of the wrist.  No overlying erythema or edema.  Good range of motion.  Area is well-circumscribed but much smaller than the right wrist.  Sensation capillary refill intact all digits. Neurologic:  Normal speech and language. No gross focal neurologic deficits are appreciated.  Skin:  Skin is warm, dry and intact. No rash noted. Psychiatric: Mood and affect are normal. Speech and behavior are normal. Patient exhibits appropriate insight and judgement.   ____________________________________________   LABS (all labs ordered are listed, but only abnormal results are displayed)  Labs Reviewed - No data to display ____________________________________________  EKG   ____________________________________________  RADIOLOGY   No results found.  ____________________________________________    PROCEDURES  Procedure(s) performed:    Procedures    Medications - No data to display   ____________________________________________   INITIAL IMPRESSION / ASSESSMENT AND PLAN / ED COURSE  Pertinent labs & imaging results that were available during my care of the patient were reviewed by me and considered in my medical decision making (see chart for details).  Review of the [place] CSRS was performed in accordance of the [facility] prior to dispensing any controlled drugs.           Patient's diagnosis is consistent with ganglion cyst to the right and left wrist.  Patient presented to the emergency department with findings consistent with a ganglion cyst bilaterally.  Patient states that these findings have been there for several months to the right wrist, newly developed  to the left wrist.  Physical exam was consistent with ganglion cyst with no evidence of infection.  Patient is referred to orthopedics for further management of condition.  Patient will meloxicam for pain relief until he can see orthopedics. Patient is given ED precautions to return to the ED for any worsening or new symptoms.     ____________________________________________  FINAL CLINICAL IMPRESSION(S) / ED DIAGNOSES  Final diagnoses:  Ganglion cyst of both wrists      NEW MEDICATIONS STARTED DURING THIS VISIT:  ED Discharge Orders         Ordered    meloxicam (MOBIC) 15 MG tablet  Daily        07/21/20 1955              This chart was dictated using voice recognition software/Dragon. Despite best efforts to proofread, errors can occur which can change the meaning. Any change was purely unintentional.    Locklyn Henriquez, Delorise Royals, PA-C  07/21/20 Wilkie Aye, MD 07/22/20 [phone]

## 2020-08-02 ENCOUNTER — Other Ambulatory Visit: Payer: Self-pay

## 2020-08-02 ENCOUNTER — Emergency Department: Payer: No Typology Code available for payment source

## 2020-08-02 ENCOUNTER — Emergency Department
Admission: EM | Admit: 2020-08-02 | Discharge: 2020-08-03 | Disposition: A | Discharge status: Home / Self Care | Payer: No Typology Code available for payment source | Attending: Emergency Medicine | Admitting: Emergency Medicine

## 2020-08-02 DIAGNOSIS — S8992XA Unspecified injury of left lower leg, initial encounter: Secondary | ICD-10-CM | POA: Diagnosis present

## 2020-08-02 DIAGNOSIS — G44319 Acute post-traumatic headache, not intractable: Secondary | ICD-10-CM | POA: Diagnosis not present

## 2020-08-02 DIAGNOSIS — F17210 Nicotine dependence, cigarettes, uncomplicated: Secondary | ICD-10-CM | POA: Diagnosis not present

## 2020-08-02 DIAGNOSIS — Y92410 Unspecified street and highway as the place of occurrence of the external cause: Secondary | ICD-10-CM | POA: Diagnosis not present

## 2020-08-02 DIAGNOSIS — S8012XA Contusion of left lower leg, initial encounter: Secondary | ICD-10-CM | POA: Diagnosis not present

## 2020-08-02 MED ORDER — METHOCARBAMOL 500 MG PO TABS: 500.0000 mg | ORAL_TABLET | Freq: Four times a day (QID) | ORAL | 0 refills | Status: DC

## 2020-08-02 MED ORDER — MELOXICAM 15 MG PO TABS: 15.0000 mg | ORAL_TABLET | Freq: Every day | ORAL | 0 refills | Status: DC
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
## 2020-08-02 NOTE — ED Notes (Signed)
Pt is refusing police forensic blood draw- officer informed this RN not to let pt eat or drink anything as they were attempting to obtain court order for blood
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
## 2020-08-03 NOTE — ED Notes (Signed)
Pt unable to find a ride but has been able to talk to multiple people. Pt taken to lobby via wheelchair and is sitting by the phone to continue calling for assistance. First nurse and screener both aware. Pts ticket and discharge papers with patient.

## 2020-08-03 NOTE — ED Notes (Signed)
Pt attempting to call for a ride.

## 2020-09-05 ENCOUNTER — Encounter: Payer: Self-pay | Admitting: Intensive Care

## 2020-09-05 ENCOUNTER — Emergency Department
Admission: EM | Admit: 2020-09-05 | Discharge: 2020-09-05 | Disposition: A | Discharge status: Home / Self Care | Payer: HRSA Program | Attending: Emergency Medicine | Admitting: Emergency Medicine

## 2020-09-05 ENCOUNTER — Other Ambulatory Visit: Payer: Self-pay

## 2020-09-05 DIAGNOSIS — B349 Viral infection, unspecified: Secondary | ICD-10-CM | POA: Diagnosis not present

## 2020-09-05 DIAGNOSIS — F17210 Nicotine dependence, cigarettes, uncomplicated: Secondary | ICD-10-CM | POA: Diagnosis not present

## 2020-09-05 DIAGNOSIS — R519 Headache, unspecified: Secondary | ICD-10-CM | POA: Diagnosis present

## 2020-09-05 DIAGNOSIS — J3489 Other specified disorders of nose and nasal sinuses: Secondary | ICD-10-CM | POA: Insufficient documentation

## 2020-09-05 DIAGNOSIS — U071 COVID-19: Secondary | ICD-10-CM | POA: Insufficient documentation

## 2020-09-05 NOTE — Discharge Instructions (Addendum)
Please take Tylenol and ibuprofen as needed for your symptoms of body aches, fevers and chills.  Make sure you are drinking lots of fluids.  Use over-the-counter cough and cold medication as needed for cough congestion runny nose.  If any fevers above 101 that are not going down with Tylenol and ibuprofen, chest pain, shortness of breath please return to the emergency department.  Your COVID test results will be present in 6 hours, if positive you will need to quarantine 10 days

## 2020-09-05 NOTE — ED Provider Notes (Addendum)
[facility] EMERGENCY DEPARTMENT Provider Note   CSN: [id number] Arrival date & time: 09/05/20  1255     History Chief Complaint  Patient presents with  . Headache  . URI  . Generalized Body Aches    Charles Montes is a 35 y.o. male presents to the emergency department for evaluation of headache, body aches, subjective fevers and chills.  Symptoms began this morning upon arriving.  His employer made him come in for testing.  He denies any chest pain, shortness of breath, abdominal pain nausea vomiting or diarrhea.  He is not vaccinated, no exposure to COVID that he knows of.  He denies any sore throat or difficulty swallowing.  HPI     History reviewed. No pertinent past medical history.  There are no problems to display for this patient.   History reviewed. No pertinent surgical history.     History reviewed. No pertinent family history.  Social History   Tobacco Use  . Smoking status: Current Every Day Smoker    Packs/day: 1.00    Types: Cigarettes  . Smokeless tobacco: Never Used  Substance Use Topics  . Alcohol use: Yes    Alcohol/week: 8.0 standard drinks    Types: 6 Cans of beer, 2 Shots of liquor per week  . Drug use: Yes    Types: Marijuana    Home Medications Prior to Admission medications   Medication Sig Start Date End Date Taking? Authorizing Provider  cyclobenzaprine (FLEXERIL) 10 MG tablet Take 1 tablet (10 mg total) by mouth 3 (three) times daily as needed. 07/23/19   Joni Reining, PA-C  ibuprofen (ADVIL) 600 MG tablet Take 1 tablet (600 mg total) by mouth every 8 (eight) hours as needed. 07/23/19   Joni Reining, PA-C  meloxicam (MOBIC) 15 MG tablet Take 1 tablet (15 mg total) by mouth daily. 08/02/20   Cuthriell, Delorise Royals, PA-C  methocarbamol (ROBAXIN) 500 MG tablet Take 1 tablet (500 mg total) by mouth 4 (four) times daily. 08/02/20   Cuthriell, Delorise Royals, PA-C    Allergies    Patient has no known  allergies.  Review of Systems   Review of Systems  Constitutional: Positive for fever (Subjective).  HENT: Positive for congestion, rhinorrhea and sore throat. Negative for sinus pressure and sinus pain.   Respiratory: Negative for cough, wheezing and stridor.   Gastrointestinal: Negative for diarrhea, nausea and vomiting.  Musculoskeletal: Positive for myalgias. Negative for arthralgias and neck stiffness.  Skin: Negative for rash.  Neurological: Negative for dizziness.    Physical Exam Updated Vital Signs BP 122/72 (BP Location: Left Arm)   Pulse 100   Temp 98.6 F (37 C) (Oral)   Resp 16   Ht 5\' 6"  (1.676 m)   Wt 83.9 kg   SpO2 100%   BMI 29.86 kg/m   Physical Exam Constitutional:      General: He is not in acute distress.    Appearance: He is well-developed and well-nourished.  HENT:     Head: Normocephalic and atraumatic.     Jaw: No trismus.     Right Ear: Hearing, tympanic membrane, ear canal and external ear normal.     Left Ear: Hearing, tympanic membrane, ear canal and external ear normal.     Nose: Rhinorrhea present. No nasal septal hematoma.     Right Sinus: No maxillary sinus tenderness or frontal sinus tenderness.     Left Sinus: No maxillary sinus tenderness or frontal sinus tenderness.  Mouth/Throat:     Mouth: Mucous membranes are moist.     Pharynx: No oropharyngeal exudate, posterior oropharyngeal edema, posterior oropharyngeal erythema or uvula swelling.     Tonsils: No tonsillar abscesses.  Eyes:     Extraocular Movements: Extraocular movements intact.     Conjunctiva/sclera: Conjunctivae normal.  Neck:     Meningeal: Brudzinski's sign absent.  Cardiovascular:     Rate and Rhythm: Normal rate and regular rhythm.  Pulmonary:     Effort: Pulmonary effort is normal. No respiratory distress.     Breath sounds: No stridor. No wheezing.  Chest:     Chest wall: No tenderness.  Abdominal:     General: There is no distension.     Palpations:  Abdomen is soft.     Tenderness: There is no abdominal tenderness. There is no guarding.  Musculoskeletal:        General: Normal range of motion.     Cervical back: Normal range of motion. No rigidity.  Lymphadenopathy:     Cervical: Cervical adenopathy present.  Skin:    General: Skin is warm and dry.     Findings: No rash.  Neurological:     Mental Status: He is alert and oriented to person, place, and time.     Cranial Nerves: No cranial nerve deficit.  Psychiatric:        Mood and Affect: Mood and affect and mood normal.        Behavior: Behavior normal.        Thought Content: Thought content normal.        Judgment: Judgment normal.     ED Results / Procedures / Treatments   Labs (all labs ordered are listed, but only abnormal results are displayed) Labs Reviewed  SARS CORONAVIRUS 2 (TAT 6-24 HRS)    EKG None  Radiology No results found.  Procedures Procedures (including critical care time)  Medications Ordered in ED Medications - No data to display  ED Course  I have reviewed the triage vital signs and the nursing notes.  Pertinent labs & imaging results that were available during my care of the patient were reviewed by me and considered in my medical decision making (see chart for details).    MDM Rules/Calculators/A&P                          35 year old male with viral symptoms that began this morning.  His vital signs are stable.  Afebrile, nontachycardic.  Physical exam unremarkable.  Breath sounds are clear to auscultation bilaterally with no wheezes rales or rhonchi.  COVID test is pending.  He is educated on quarantine if test is positive.   Final Clinical Impression(s) / ED Diagnoses Final diagnoses:  Viral syndrome    Rx / DC Orders ED Discharge Orders    None       Evon Slack, PA-C 09/05/20 1510    Evon Slack, PA-C 09/05/20 1510    Ronnette Juniper 09/05/20 1522    Sharman Cheek, MD 09/05/20 [phone]

## 2020-09-05 NOTE — ED Triage Notes (Signed)
Pt c/o headache, cold symptoms, and body aches that started this AM. NAD noted

## 2020-09-06 LAB — SARS CORONAVIRUS 2 (TAT 6-24 HRS): SARS Coronavirus 2: POSITIVE — AB

## 2020-09-07 ENCOUNTER — Telehealth: Payer: Self-pay

## 2020-09-07 NOTE — Telephone Encounter (Signed)
Contacted pt. In regard to additional treatment for COVID 19. States he did not know the results of his test. Patient notified of positive COVID-19 test results. Pt verbalized understanding. Pt reports symptoms of none today. Criteria for self-isolation:  -Please quarantine and isolate at home  for at least 10 days since symptoms started AND - At least 24 hours fever free without the use of fever reducing medications such as Tylenol or Ibuprofen AND - Improvement in respiratory symptoms Use over-the-counter medications for symptoms.If you develop respiratory issues/distress, seek medical care in the Emergency Department.  If you must leave home or if you have to be around others please wear a mask. Please limit contact with immediate family members in the home, practice social distancing, frequent handwashing and clean hard surfaces touched frequently with household cleaning products. Members of your household will also need to quarantine and test. Pt informed that the health department will likely follow up and may have additional recommendations. Will notify [facility]. Pt. Reports no symptoms today and he has no health history. Declines to talk about additional treatment therapies for COVID 19.

## 2020-11-11 ENCOUNTER — Other Ambulatory Visit: Payer: Self-pay

## 2020-11-11 ENCOUNTER — Emergency Department
Admission: EM | Admit: 2020-11-11 | Discharge: 2020-11-11 | Disposition: A | Discharge status: Home / Self Care | Payer: Self-pay | Attending: Emergency Medicine | Admitting: Emergency Medicine

## 2020-11-11 ENCOUNTER — Emergency Department: Payer: Self-pay

## 2020-11-11 DIAGNOSIS — R059 Cough, unspecified: Secondary | ICD-10-CM

## 2020-11-11 DIAGNOSIS — F17210 Nicotine dependence, cigarettes, uncomplicated: Secondary | ICD-10-CM | POA: Insufficient documentation

## 2020-11-11 DIAGNOSIS — J40 Bronchitis, not specified as acute or chronic: Secondary | ICD-10-CM | POA: Insufficient documentation

## 2020-11-11 MED ORDER — BENZONATATE 100 MG PO CAPS: 100.0000 mg | ORAL_CAPSULE | Freq: Three times a day (TID) | ORAL | 0 refills | Status: AC | PRN

## 2020-11-11 MED ORDER — PREDNISONE 10 MG PO TABS: ORAL_TABLET | ORAL | 0 refills | Status: AC

## 2020-11-11 MED ORDER — PREDNISONE 20 MG PO TABS
60.0000 mg | ORAL_TABLET | Freq: Once | ORAL | Status: AC
  Administered 2020-11-11: 60 mg via ORAL
  Filled 2020-11-11: qty 3

## 2020-11-11 MED ORDER — BENZONATATE 100 MG PO CAPS
100.0000 mg | ORAL_CAPSULE | Freq: Once | ORAL | Status: AC
  Administered 2020-11-11: 100 mg via ORAL
  Filled 2020-11-11: qty 1

## 2020-11-11 NOTE — ED Triage Notes (Signed)
Pt states that he has had a cough for awhile, states that he is a smoker and states that when he wakes up in the am he coughs a lot and states that when he is around dust he coughs a lot

## 2020-11-11 NOTE — ED Notes (Signed)
Patient reports dry cough x months. Patient reports hx of smoking, but denies any other precipitating factors. Patient denies nasal drainage, chest congestion, sneezing, or other cold symptoms. Patient denies known exposure to COVID. Patient denies chest pain. Patient reports some mild shortness of breath upon waking daily. Patient denies SOB right now.

## 2020-11-11 NOTE — ED Notes (Signed)
Patient provided discharge instructions including prescription information, and follow up care. Patient denies questions or concerns. Patient verbalized understanding. NADN at discharge.

## 2020-11-11 NOTE — ED Provider Notes (Signed)
[facility] Emergency Department Provider Note  ____________________________________________   Event Date/Time   First MD Initiated Contact with Patient 11/11/20 1458     (approximate)  I have reviewed the triage vital signs and the nursing notes.   HISTORY  Chief Complaint Cough  HPI Draylen Lobue is a 35 y.o. male who presents to the emergency department today for evaluation of dry cough that is been present for several months.  Patient states that he does smoke cigarettes daily, sometimes greater than 1 pack but states that it varies per day.  He denies any associated sputum production, fevers, chest pain.  He does report mild shortness of breath first thing in the morning, but states that this is due to a "coughing spell" and as soon as he finishes coughing first thing the morning, he is unable to breathe normally.  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  He denies any allergy or URI symptoms of nasal congestion, ear pain, sore throat or other.  He denies any history of asthma or COPD or other pulmonary conditions.       No past medical history on file.  There are no problems to display for this patient.   No past surgical history on file.  Prior to Admission medications   Medication Sig Start Date End Date Taking? Authorizing Provider  benzonatate (TESSALON PERLES) 100 MG capsule Take 1 capsule (100 mg total) by mouth 3 (three) times daily as needed for cough. 11/11/20 11/11/21 Yes Rodgers, Ruben Gottron, PA  predniSONE (DELTASONE) 10 MG tablet Take 6 tablets (60 mg total) by mouth daily for 1 day, THEN 5 tablets (50 mg total) daily for 1 day, THEN 4 tablets (40 mg total) daily for 1 day, THEN 3 tablets (30 mg total) daily for 1 day, THEN 2 tablets (20 mg total) daily for 1 day, THEN 1 tablet (10 mg total) daily for 1 day. 11/11/20 11/17/20 Yes Rodgers, Ruben Gottron,  PA  cyclobenzaprine (FLEXERIL) 10 MG tablet Take 1 tablet (10 mg total) by mouth 3 (three) times daily as needed. 07/23/19   Joni Reining, PA-C  ibuprofen (ADVIL) 600 MG tablet Take 1 tablet (600 mg total) by mouth every 8 (eight) hours as needed. 07/23/19   Joni Reining, PA-C  meloxicam (MOBIC) 15 MG tablet Take 1 tablet (15 mg total) by mouth daily. 08/02/20   Cuthriell, Delorise Royals, PA-C  methocarbamol (ROBAXIN) 500 MG tablet Take 1 tablet (500 mg total) by mouth 4 (four) times daily. 08/02/20   Cuthriell, Delorise Royals, PA-C    Allergies Patient has no known allergies.  No family history on file.  Social History Social History   Tobacco Use  . Smoking status: Current Every Day Smoker    Packs/day: 1.00    Types: Cigarettes  . Smokeless tobacco: Never Used  Substance Use Topics  . Alcohol use: Yes    Alcohol/week: 8.0 standard drinks    Types: 6 Cans of beer, 2 Shots of liquor per week  . Drug use: Yes    Types: Marijuana    Review of Systems Constitutional: No fever/chills Eyes: No visual changes. ENT: No sore throat. Cardiovascular: Denies chest pain. Respiratory: + Cough, denies shortness of breath. Gastrointestinal: No abdominal pain.  No nausea, no vomiting.  No diarrhea.  No constipation. Genitourinary: Negative for dysuria. Musculoskeletal: Negative for back pain. Skin: Negative for  rash. Neurological: Negative for headaches, focal weakness or numbness.  ____________________________________________   PHYSICAL EXAM:  VITAL SIGNS: ED Triage Vitals  Enc Vitals Group     BP 11/11/20 1328 106/81     Pulse Rate 11/11/20 1328 87     Resp 11/11/20 1328 16     Temp 11/11/20 1328 98.2 F (36.8 C)     Temp Source 11/11/20 1328 Oral     SpO2 11/11/20 1328 97 %     Weight 11/11/20 1329 180 lb (81.6 kg)     Height 11/11/20 1329 5\' 6"  (1.676 m)     Head Circumference --      Peak Flow --      Pain Score 11/11/20 1328 0     Pain Loc --      Pain Edu? --       Excl. in GC? --    Constitutional: Alert and oriented. Well appearing and in no acute distress. Eyes: Conjunctivae are normal. PERRL. EOMI. Head: Atraumatic. Nose: No congestion/rhinnorhea. Mouth/Throat: Mucous membranes are moist.  Oropharynx non-erythematous. Neck: No stridor.   Cardiovascular: Normal rate, regular rhythm. Grossly normal heart sounds.  Good peripheral circulation. Respiratory: Normal respiratory effort.  No retractions. Lungs with coarse breath sounds throughout without any noted wheezing, rhonchi or crackles. Gastrointestinal: Soft and nontender. No distention. No abdominal bruits. No CVA tenderness. Musculoskeletal: No lower extremity tenderness nor edema.  No joint effusions. Neurologic:  Normal speech and language. No gross focal neurologic deficits are appreciated. No gait instability. Skin:  Skin is warm, dry and intact. No rash noted. Psychiatric: Mood and affect are normal. Speech and behavior are normal.  ____________________________________________   ____________________________________________  RADIOLOGY 11/13/20, personally viewed and evaluated these images (plain radiographs) as part of my medical decision making, as well as reviewing the written report by the radiologist.  ED provider interpretation: Increased perihilar markings with peribronchial cuffing, no evidence of acute consolidation or other acute disease  Official radiology report(s): DG Chest 2 View  Result Date: 11/11/2020 CLINICAL DATA:  Cough. EXAM: CHEST - 2 VIEW COMPARISON:  None. FINDINGS: The heart size and mediastinal contours are within normal limits. Both lungs are clear. The visualized skeletal structures are unremarkable. IMPRESSION: No active cardiopulmonary disease. Electronically Signed   By: 11/13/2020 M.D.   On: 11/11/2020 15:29    ____________________________________________   INITIAL IMPRESSION / ASSESSMENT AND PLAN / ED COURSE  As part of my medical  decision making, I reviewed the following data within the electronic MEDICAL RECORD NUMBER Nursing notes reviewed and incorporated, Radiograph reviewed and Notes from prior ED visits        Patient is a 35 year old male who presents to the emergency department for evaluation of cough for several months without fever, nasal congestion, sputum production or other associated symptoms.  See HPI for further details.  In triage, the patient has normal vital signs.  On physical exam, auscultation of the lungs reveals coarse breath sounds without any wheezing, rhonchi or crackles appreciated.  Remainder physical exam is grossly within normal limits.  X-ray was obtained given chronicity of his cough and there is evidence of increased perihilar markings as well as peribronchial cuffing.  Given no other symptoms, feel this is most likely related to the patient's smoking.  Smoking cessation was discussed at length with the patient.  In the interim, we will treat the patient with a prednisone Dosepak for inflammation and will give short course of Tessalon Perles for symptomatic  management.  Patient is amenable with this plan, he stable this time for outpatient follow-up.      ____________________________________________   FINAL CLINICAL IMPRESSION(S) / ED DIAGNOSES  Final diagnoses:  Cough  Bronchitis     ED Discharge Orders         Ordered    predniSONE (DELTASONE) 10 MG tablet        11/11/20 1551    benzonatate (TESSALON PERLES) 100 MG capsule  3 times daily PRN        11/11/20 1551          *Please note:  Wanda Plump was evaluated in Emergency Department on 11/11/2020 for the symptoms described in the history of present illness. He was evaluated in the context of the global COVID-19 pandemic, which necessitated consideration that the patient might be at risk for infection with the SARS-CoV-2 virus that causes COVID-19. Institutional protocols and algorithms that pertain to the evaluation of  patients at risk for COVID-19 are in a state of rapid change based on information released by regulatory bodies including the CDC and federal and state organizations. These policies and algorithms were followed during the patient's care in the ED.  Some ED evaluations and interventions may be delayed as a result of limited staffing during and the pandemic.*   Note:  This document was prepared using Dragon voice recognition software and may include unintentional dictation errors.   Lucy Chris, PA 11/11/20 1656    Concha Se, MD 11/11/20 [phone]

## 2021-04-26 ENCOUNTER — Other Ambulatory Visit: Payer: Self-pay

## 2021-04-26 ENCOUNTER — Emergency Department
Admission: EM | Admit: 2021-04-26 | Discharge: 2021-04-26 | Disposition: A | Discharge status: Home / Self Care | Payer: Self-pay | Attending: Emergency Medicine | Admitting: Emergency Medicine

## 2021-04-26 DIAGNOSIS — Z20822 Contact with and (suspected) exposure to covid-19: Secondary | ICD-10-CM | POA: Insufficient documentation

## 2021-04-26 DIAGNOSIS — F17210 Nicotine dependence, cigarettes, uncomplicated: Secondary | ICD-10-CM | POA: Insufficient documentation

## 2021-04-26 DIAGNOSIS — Z28310 Unvaccinated for covid-19: Secondary | ICD-10-CM | POA: Insufficient documentation

## 2021-04-26 DIAGNOSIS — B349 Viral infection, unspecified: Secondary | ICD-10-CM | POA: Insufficient documentation

## 2021-04-26 LAB — RESP PANEL BY RT-PCR (FLU A&B, COVID) ARPGX2
Influenza A by PCR: NEGATIVE
Influenza B by PCR: NEGATIVE
SARS Coronavirus 2 by RT PCR: NEGATIVE

## 2021-04-26 NOTE — ED Provider Notes (Signed)
[facility] Emergency Department Provider Note  ____________________________________________   Event Date/Time   First MD Initiated Contact with Patient 04/26/21 1933     (approximate)  I have reviewed the triage vital signs    HISTORY  Chief Complaint Generalized Body Aches    HPI Buckley Bradly is a 35 y.o. male who presents with viral symptoms. Pt reports multiple symptoms including chills and body aches.  Patient states that he was at work this morning and he started to feel the symptoms.  He is reports a cough but states is chronic from his smoking history.  He states that he was able to still work but he just was not feeling very well and that his work wanted to come and be evaluated for COVID.  He states that he took some ibuprofen and this helped his symptoms.  Patient is not vaccinated.  Has not had COVID recently.  He denies any sore throat, urinary issues, shortness of breath, abdominal pain.  Medical: None  There are no problems to display for this patient.   No past surgical history on file.  Prior to Admission medications   Medication Sig Start Date End Date Taking? Authorizing Provider  benzonatate (TESSALON PERLES) 100 MG capsule Take 1 capsule (100 mg total) by mouth 3 (three) times daily as needed for cough. 11/11/20 11/11/21  Lucy Chris, PA  cyclobenzaprine (FLEXERIL) 10 MG tablet Take 1 tablet (10 mg total) by mouth 3 (three) times daily as needed. 07/23/19   Joni Reining, PA-C  ibuprofen (ADVIL) 600 MG tablet Take 1 tablet (600 mg total) by mouth every 8 (eight) hours as needed. 07/23/19   Joni Reining, PA-C  meloxicam (MOBIC) 15 MG tablet Take 1 tablet (15 mg total) by mouth daily. 08/02/20   Cuthriell, Delorise Royals, PA-C  methocarbamol (ROBAXIN) 500 MG tablet Take 1 tablet (500 mg total) by mouth 4 (four) times daily. 08/02/20   Cuthriell, Delorise Royals, PA-C    Allergies Patient has no known allergies.  No family  history on file.  Social History Social History   Tobacco Use   Smoking status: Every Day    Packs/day: 1.00    Types: Cigarettes   Smokeless tobacco: Never  Substance Use Topics   Alcohol use: Yes    Alcohol/week: 8.0 standard drinks    Types: 6 Cans of beer, 2 Shots of liquor per week   Drug use: Yes    Types: Marijuana      Review of Systems Constitutional: Positive chills Eyes: No visual changes. ENT: No sore throat. Cardiovascular: Denies chest pain. Respiratory: Denies severe shortness of breath Gastrointestinal: No abdominal pain.  No nausea, no vomiting.  No diarrhea.  No constipation. Genitourinary: Negative for dysuria. Musculoskeletal: Negative for back pain.  Positive body aches Skin: Negative for rash. Neurological: Negative for headaches, focal weakness or numbness. All other ROS negative ____________________________________________   PHYSICAL EXAM:  VITAL SIGNS: ED Triage Vitals [04/26/21 1919]  Enc Vitals Group     BP (!) 149/84     Pulse Rate 76     Resp 20     Temp 98.7 F (37.1 C)     Temp Source Oral     SpO2 99 %     Weight 176 lb (79.8 kg)     Height 5\' 6"  (1.676 m)     Head Circumference      Peak Flow      Pain Score 6  Pain Loc      Pain Edu?      Excl. in GC?     Constitutional: Alert and oriented. Well appearing and in no acute distress. Eyes: Conjunctivae are normal. EOMI. Head: Atraumatic. Nose: No congestion/rhinnorhea. Mouth/Throat: Mucous membranes are moist.   Neck: No stridor. Trachea Midline. FROM Cardiovascular: Normal rate, regular rhythm. Good peripheral circulation. Respiratory: no audible stridor, no increased work of breathing  Gastrointestinal: Soft and nontender. No distention.  Musculoskeletal: No lower extremity tenderness nor edema.  No joint effusions. Neurologic:  Normal speech and language. No gross focal neurologic deficits are appreciated.  Skin:  Skin is warm, dry and intact. No rash  noted. Psychiatric: Mood and affect are normal. Speech and behavior are normal. GU: Deferred   ____________________________________________   LABS (all labs ordered are listed, but only abnormal results are displayed)  Labs Reviewed  RESP PANEL BY RT-PCR (FLU A&B, COVID) [id number]   ____________________________________________    PROCEDURES  Procedure(s) performed (including Critical Care):  Procedures   ____________________________________________   INITIAL IMPRESSION / ASSESSMENT AND PLAN / ED COURSE  Janece Canterbury Amescua was evaluated in Emergency Department on 04/26/2021 for the symptoms described in the history of present illness. He was evaluated in the context of the global COVID-19 pandemic, which necessitated consideration that the patient might be at risk for infection with the SARS-CoV-2 virus that causes COVID-19. Institutional protocols and algorithms that pertain to the evaluation of patients at risk for COVID-19 are in a state of rapid change based on information released by regulatory bodies including the CDC and federal and state organizations. These policies and algorithms were followed during the patient's care in the ED.     Pt presents with body aches and chills.  Given the prevalence of  COVID 19 I suspect this is mostly likely secondary to viral illness such as COVID 19.  Pt very well appearing. Well hydrated on exam, low suspicion for electrolyte abnormalities or AKI.  Offered getting blood work to make sure no evidence of muscle breakdown given patient does work outside the patient states that he does not want to have blood work done and he is just here for a COVID test for work.  I explained to patient that he needs to stay well-hydrated and that if his symptoms are not getting better and he is COVID-negative that he will need to return to the ER for blood work.    Pt not hypoxic and breathing well therefore does not require admission to hospital.  Low suspicion  of PE/ACS given no chest pain/SOB and so well appearing.  Will proceed with COVID testing. We discussed follow up in [facility] for results and quarantine until those results are back.    Discussed symptoms management with tylenol and ibuprofen and stay well-hydrated.          ____________________________________________   FINAL CLINICAL IMPRESSION(S) / ED DIAGNOSES   Final diagnoses:  Viral syndrome      MEDICATIONS GIVEN DURING THIS VISIT:  Medications - No data to display   ED Discharge Orders     None        Note:  This document was prepared using Dragon voice recognition software and may include unintentional dictation errors.   Concha Se, MD 04/26/21 [phone]

## 2021-04-26 NOTE — Discharge Instructions (Addendum)
Your COVID test is pending.  You can follow this up in MyChart will we usually call if there is a positive test.  You take Tylenol 1 g every 8 hours and ibuprofen 600 every 6 hours with food.  Make sure he stay well-hydrated with fluids.  If your test is negative and you continue to have symptoms you need to return to the ER for lab testing to make sure no evidence of muscle breakdown or dehydration.

## 2021-04-26 NOTE — ED Triage Notes (Signed)
Pt in with co body aches and chills that started this am. Unsure of ever, denies any n.v.d.
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
## 2022-07-22 IMAGING — CR DG CHEST 2V
2 series · 2 of 2 positions shown · non-contrast
Comparison: None.

CLINICAL DATA: Cough.

EXAM:
CHEST - 2 VIEW

[chest pa]
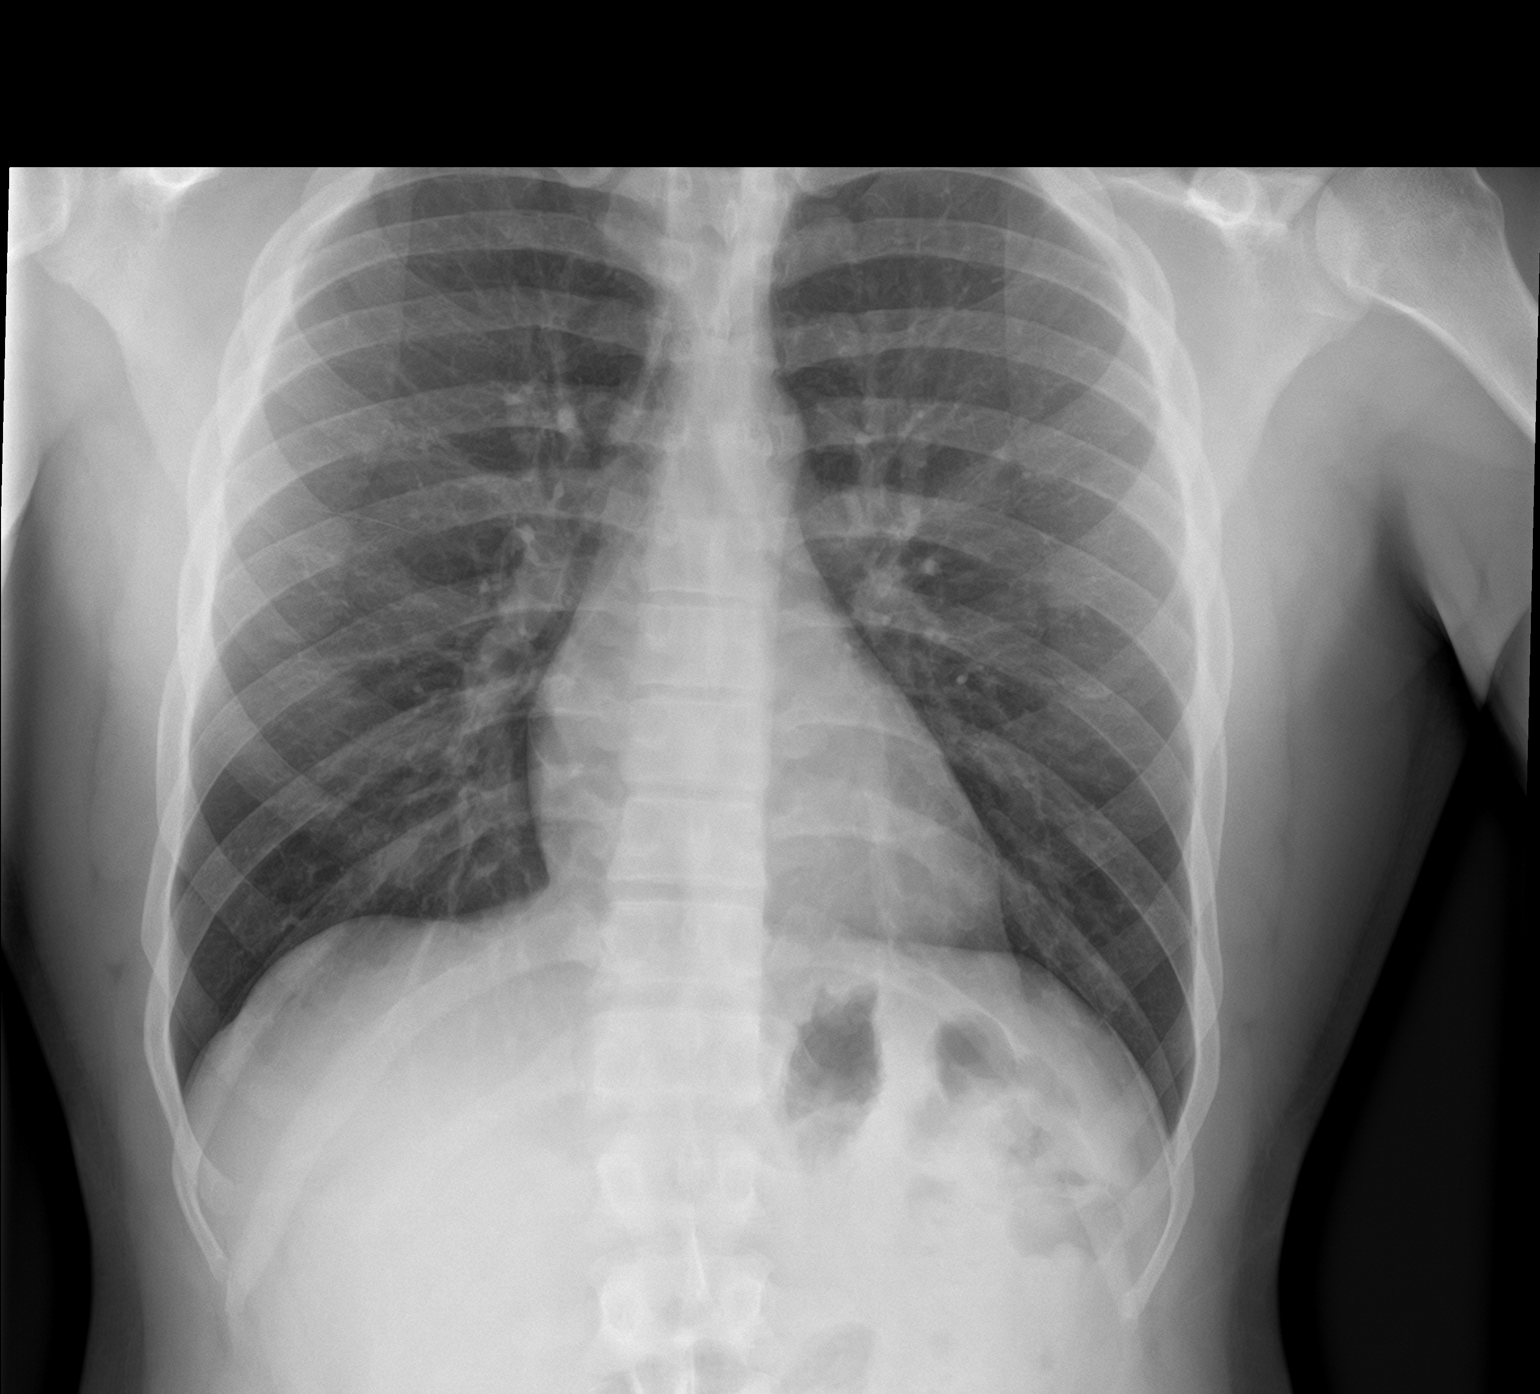

[chest lat]
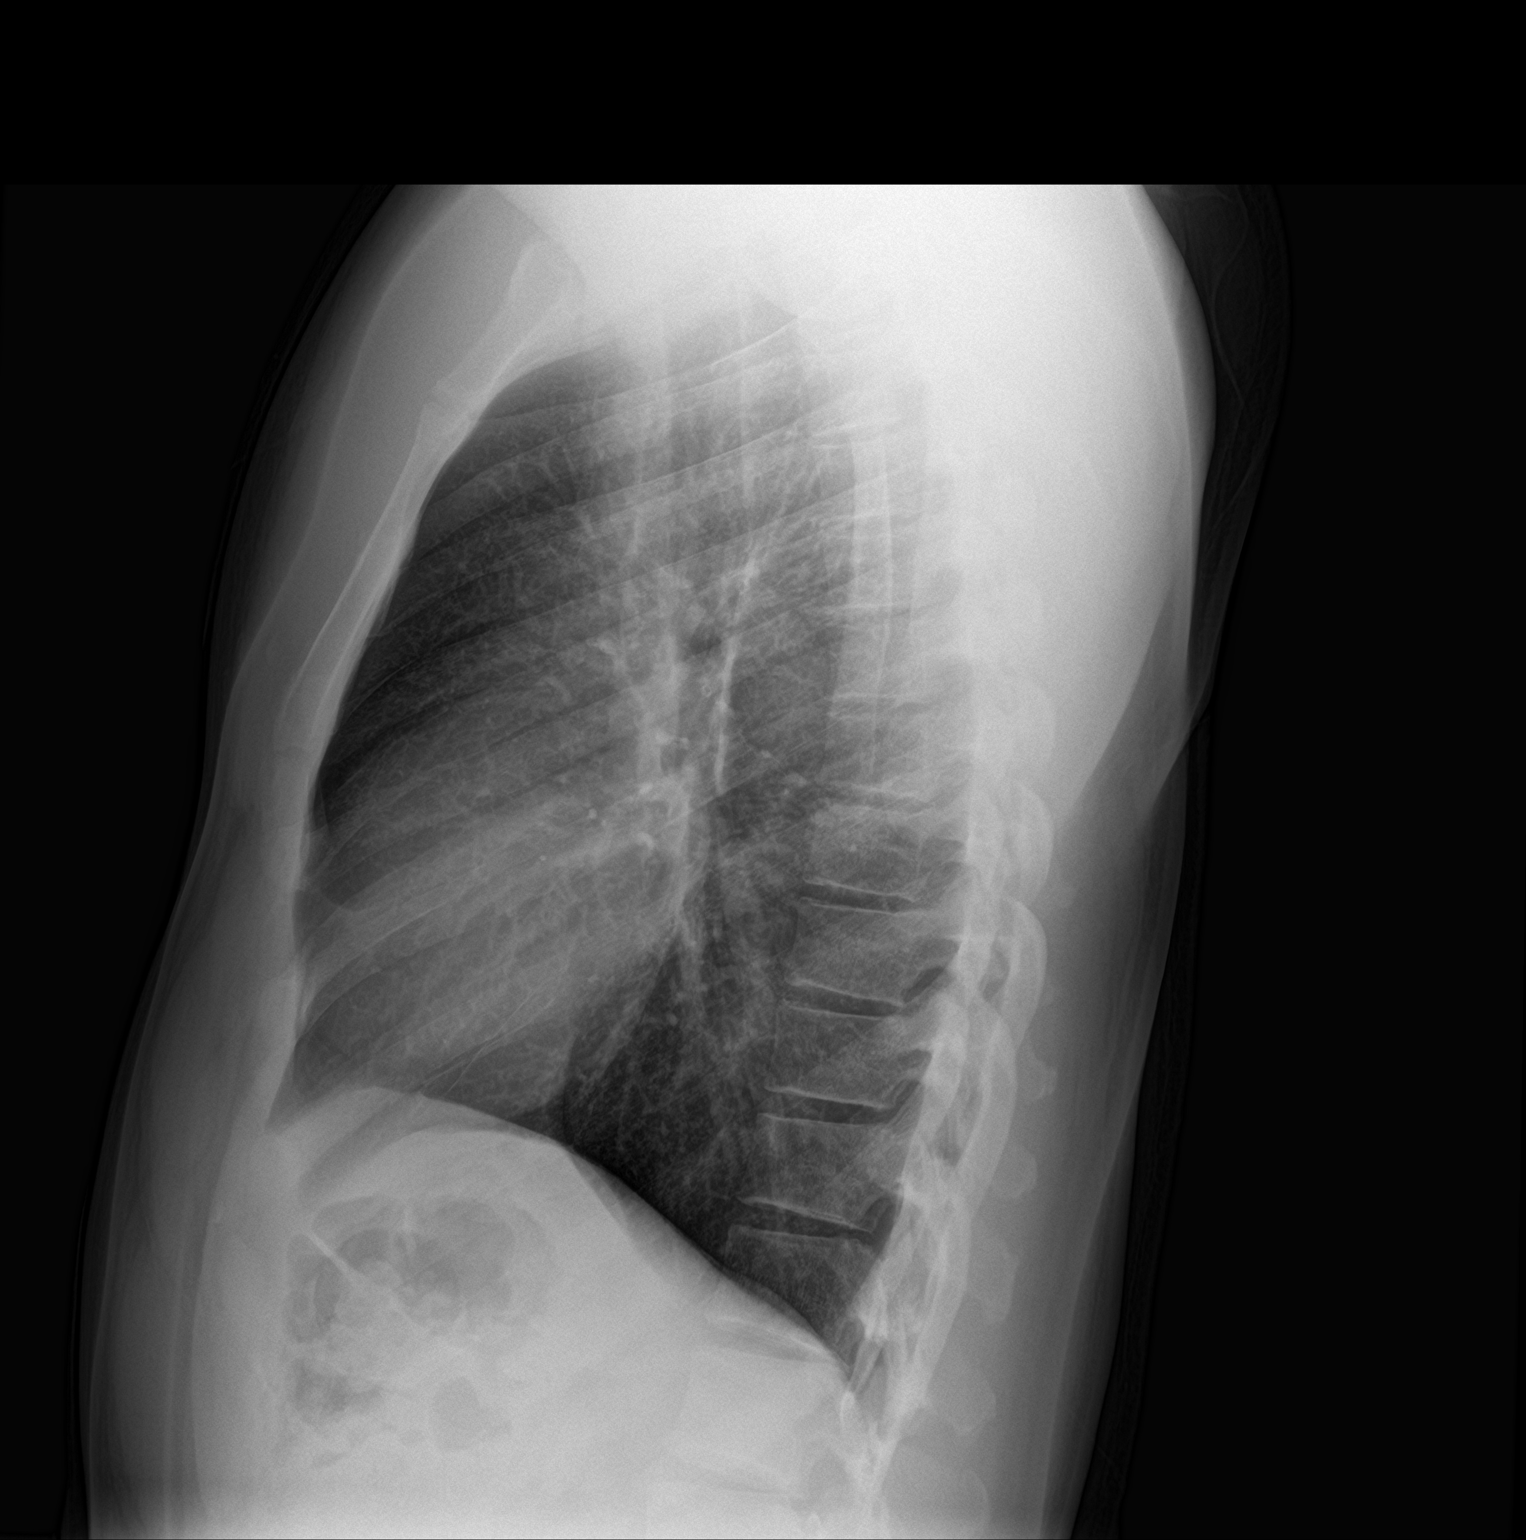

[2 of 2 positions shown; findings below may reference images not displayed]

FINDINGS: The heart size and mediastinal contours are within normal limits.
Both lungs are clear. The visualized skeletal structures are
unremarkable.
IMPRESSION: No active cardiopulmonary disease.

## 2022-12-19 ENCOUNTER — Other Ambulatory Visit: Payer: Self-pay

## 2022-12-19 ENCOUNTER — Emergency Department
Admission: EM | Admit: 2022-12-19 | Discharge: 2022-12-19 | Disposition: A | Discharge status: Home / Self Care | Payer: Self-pay | Attending: Emergency Medicine | Admitting: Emergency Medicine

## 2022-12-19 DIAGNOSIS — L03116 Cellulitis of left lower limb: Secondary | ICD-10-CM | POA: Insufficient documentation

## 2022-12-19 MED ORDER — MUPIROCIN 2 % EX OINT: 1.0000 | TOPICAL_OINTMENT | Freq: Two times a day (BID) | CUTANEOUS | 0 refills | Status: DC

## 2022-12-19 MED ORDER — CEPHALEXIN 500 MG PO CAPS
500.0000 mg | ORAL_CAPSULE | Freq: Four times a day (QID) | ORAL | 0 refills | Status: AC
Start: 2022-12-19 — End: 2022-12-26

## 2022-12-19 NOTE — ED Provider Notes (Signed)
[facility] Provider Note    Event Date/Time   First MD Initiated Contact with Patient 12/19/22 1752     (approximate)   History   Insect Bite   HPI  Charles Montes is a 37 y.o. male who presents today for evaluation of irritation behind his left knee.  He reports that this has happened to him before and most recently got really exacerbated over the last week or so.  He denies any pain in his knee or swelling anteriorly.  No fevers or chills.  There are no problems to display for this patient.         Physical Exam   Triage Vital Signs: ED Triage Vitals  Enc Vitals Group     BP 12/19/22 1723 129/83     Pulse Rate 12/19/22 1723 70     Resp 12/19/22 1723 18     Temp 12/19/22 1723 98.3 F (36.8 C)     Temp Source 12/19/22 1723 Oral     SpO2 12/19/22 1723 97 %     Weight 12/19/22 1723 175 lb (79.4 kg)     Height 12/19/22 1723  (1.676 m)     Head Circumference --      Peak Flow --      Pain Score 12/19/22 1727 2     Pain Loc --      Pain Edu? --      Excl. in GC? --     Most recent vital signs: Vitals:   12/19/22 1723  BP: 129/83  Pulse: 70  Resp: 18  Temp: 98.3 F (36.8 C)  SpO2: 97%    Physical Exam Vitals and nursing note reviewed.  Constitutional:      General: Awake and alert. No acute distress.    Appearance: Normal appearance. The patient is normal weight.  HENT:     Head: Normocephalic and atraumatic.     Mouth: Mucous membranes are moist.  Eyes:     General: PERRL. Normal EOMs        Right eye: No discharge.        Left eye: No discharge.     Conjunctiva/sclera: Conjunctivae normal.  Cardiovascular:     Rate and Rhythm: Normal rate and regular rhythm.     Pulses: Normal pulses.  Pulmonary:     Effort: Pulmonary effort is normal. No respiratory distress.     Breath sounds: Normal breath sounds.  Abdominal:     Abdomen is soft. There is no abdominal tenderness. \ Musculoskeletal:        General: No  swelling. Normal range of motion.     Cervical back: Normal range of motion and neck supple.  Skin:    General: Skin is warm and dry.     Capillary Refill: Capillary refill takes less than 2 seconds.     Findings: 2 cm area of irritation to the popliteal fossa.  There is no pitting edema.  There is no surrounding erythema.  There is mild cracking at the hair follicles.  No lymphangitis.  No active drainage or bleeding. Neurological:     Mental Status: The patient is awake and alert.        ED Results / Procedures / Treatments   Labs (all labs ordered are listed, but only abnormal results are displayed) Labs Reviewed - No data to display   EKG     RADIOLOGY    PROCEDURES:  Critical Care performed:   Procedures  MEDICATIONS ORDERED IN ED: Medications - No data to display   IMPRESSION / MDM / ASSESSMENT AND PLAN / ED COURSE  I reviewed the triage vital signs and the nursing notes.   Differential diagnosis includes, but is not limited to, cellulitis, insect bite, folliculitis, fungal infection, keloid.  Patient is awake and alert, hemodynamically stable and afebrile.  He is nontoxic in appearance.  He has a small 2 cm area of skin irritation in his popliteal fossa.  There is skin cracking noted.  No satellite lesions.  He has full normal range of motion of his knee, no knee effusion, no warmth or erythema to his knee, do not suspect septic joint.  There is no pitting edema in his leg, legs are equal in circumference bilaterally, not consistent with DVT.  Will treat for cellulitis/folliculitis.  He was given an oral antibiotic as well as topical mupirocin to help keep the area moisturized.  We discussed the importance of keeping this clean with soap and water.  We discussed return precautions.  Patient understands and agrees with plan.  He was discharged in stable condition.   Patient's presentation is most consistent with acute, uncomplicated illness.   FINAL CLINICAL  IMPRESSION(S) / ED DIAGNOSES   Final diagnoses:  Cellulitis of left lower extremity     Rx / DC Orders   ED Discharge Orders          Ordered    cephALEXin (KEFLEX) 500 MG capsule  4 times daily        12/19/22 1802    mupirocin ointment (BACTROBAN) 2 %  2 times daily        12/19/22 1802             Note:  This document was prepared using Dragon voice recognition software and may include unintentional dictation errors.   Keturah Shavers 12/19/22 1853    Sharman Cheek, MD 12/19/22 2232

## 2022-12-19 NOTE — Discharge Instructions (Signed)
Keep areas clean and dry.  Use antibiotics as prescribed.  Please return for any new, worsening, or change in symptoms or other concerns.  It was a pleasure caring for you today.

## 2022-12-19 NOTE — ED Triage Notes (Signed)
Pt presents to ED C/O insect bite to back of L knee that he noticed about 1 week ago.

## 2023-10-12 ENCOUNTER — Ambulatory Visit: Payer: Self-pay

## 2023-11-14 ENCOUNTER — Emergency Department
Admission: EM | Admit: 2023-11-14 | Discharge: 2023-11-14 | Disposition: A | Discharge status: Home / Self Care | Payer: Self-pay | Attending: Emergency Medicine | Admitting: Emergency Medicine

## 2023-11-14 ENCOUNTER — Other Ambulatory Visit: Payer: Self-pay

## 2023-11-14 DIAGNOSIS — J4521 Mild intermittent asthma with (acute) exacerbation: Secondary | ICD-10-CM | POA: Insufficient documentation

## 2023-11-14 MED ORDER — PREDNISONE 50 MG PO TABS: ORAL_TABLET | ORAL | 0 refills | Status: DC

## 2023-11-14 MED ORDER — ALBUTEROL SULFATE HFA 108 (90 BASE) MCG/ACT IN AERS: 2.0000 | INHALATION_SPRAY | Freq: Four times a day (QID) | RESPIRATORY_TRACT | 2 refills | Status: AC | PRN

## 2023-11-14 NOTE — Discharge Instructions (Addendum)
 You have been diagnosed with asthma please take prednisone 1 tablet by mouth with breakfast for 5 days.  Please use albuterol inhaler 2 puffs every 6 hours as needed for wheezing or shortness of breath.  Please come back to ED or go to your PCP you have new symptoms or symptoms worsen.  As soon as you get insurance please call and make an appointment with [facility] clinic for a follow-up with chest x-ray.

## 2023-11-14 NOTE — ED Triage Notes (Signed)
 Pt complains of nonproductive cough x 3 months worse when waking up in the mornings. PT has been seen previous for same complaint but did not go pick up prescription and states cough has gotten worse.

## 2023-11-14 NOTE — ED Provider Notes (Signed)
 [facility] Provider Note    Event Date/Time   First MD Initiated Contact with Patient 11/14/23 1705     (approximate)   History   Cough   HPI  Charles Montes is a 38 y.o. male who presents today with history of 3 months of dry cough that increased at nights, and when he is smoking.  Patient states wheezing on nights.  Patient denies fever, secretions, nasal congestion.  Patient works at a factory making batteries.  Patient does not have insurance right now.     Physical Exam   Triage Vital Signs: ED Triage Vitals [11/14/23 1606]  Encounter Vitals Group     BP (!) 143/92     Systolic BP Percentile      Diastolic BP Percentile      Pulse Rate 77     Resp 18     Temp 98.6 F (37 C)     Temp Source Oral     SpO2 96 %     Weight 175 lb (79.4 kg)     Height 5\' 6"  (1.676 m)     Head Circumference      Peak Flow      Pain Score 0     Pain Loc      Pain Education      Exclude from Growth Chart     Most recent vital signs: Vitals:   11/14/23 1606  BP: (!) 143/92  Pulse: 77  Resp: 18  Temp: 98.6 F (37 C)  SpO2: 96%     Constitutional: Alert, NAD. Able to speak in complete sentences without cough or dyspnea  Eyes: Conjunctivae are normal.  Head: Atraumatic. Nose: No congestion/rhinnorhea. Mouth/Throat: Mucous membranes are moist.   Neck: Painless ROM. Supple. No JVD, nodes, thyromegaly  Cardiovascular:   Good peripheral circulation.RRR no murmurs, gallops, rubs  Respiratory: Normal respiratory effort.  No retractions. Clear to auscultation bilaterally without wheezing or crackles  Gastrointestinal: Soft and nontender.  Musculoskeletal:  no deformity Neurologic:  MAE spontaneously. No gross focal neurologic deficits are appreciated.  Skin:  Skin is warm, dry and intact. No rash noted. Psychiatric: Mood and affect are normal. Speech and behavior are normal.    ED Results / Procedures / Treatments   Labs (all labs ordered are  listed, but only abnormal results are displayed) Labs Reviewed - No data to display   EKG    RADIOLOGY     PROCEDURES:  Critical Care performed:   Procedures   MEDICATIONS ORDERED IN ED: Medications - No data to display    IMPRESSION / MDM / ASSESSMENT AND PLAN / ED COURSE  I reviewed the triage vital signs and the nursing notes.  Differential diagnosis includes, but is not limited to, asthma, influenza, pneumonia, COPD  Patient's presentation is most consistent with acute, uncomplicated illness.   Patient's diagnosis is consistent with asthma.  Explained to patient the need of x-ray but he prefers to go to his PCP and have x-ray next month that he will have insurance. I did review the patient's allergies and medications.The patient is in stable and satisfactory condition for discharge home  Patient will be discharged home with prescriptions for albuterol inhaler, prednisone. Patient is to follow up with PCP as needed or otherwise directed. Patient is given ED precautions to return to the ED for any worsening or new symptoms. Discussed plan of care with patient, answered all of patient's questions, Patient agreeable to plan of care. Advised patient  to take medications according to the instructions on the label. Discussed possible side effects of new medications. Patient verbalized understanding.    FINAL CLINICAL IMPRESSION(S) / ED DIAGNOSES   Final diagnoses:  None     Rx / DC Orders   ED Discharge Orders          Ordered    predniSONE (DELTASONE) 50 MG tablet        11/14/23 1752    albuterol (VENTOLIN HFA) 108 (90 Base) MCG/ACT inhaler  Every 6 hours PRN        11/14/23 1752             Note:  This document was prepared using Dragon voice recognition software and may include unintentional dictation errors.   Gladys Damme, PA-C 11/14/23 1754    Chesley Noon, MD 11/14/23 2358

## 2024-01-04 ENCOUNTER — Other Ambulatory Visit: Payer: Self-pay

## 2024-01-04 ENCOUNTER — Emergency Department
Admission: EM | Admit: 2024-01-04 | Discharge: 2024-01-04 | Disposition: A | Discharge status: Home / Self Care | Payer: Self-pay | Attending: Emergency Medicine | Admitting: Emergency Medicine

## 2024-01-04 DIAGNOSIS — W228XXA Striking against or struck by other objects, initial encounter: Secondary | ICD-10-CM | POA: Insufficient documentation

## 2024-01-04 DIAGNOSIS — S0502XA Injury of conjunctiva and corneal abrasion without foreign body, left eye, initial encounter: Secondary | ICD-10-CM | POA: Insufficient documentation

## 2024-01-04 MED ORDER — FLUORESCEIN SODIUM 1 MG OP STRP
1.0000 | ORAL_STRIP | Freq: Once | OPHTHALMIC | Status: DC
  Filled 2024-01-04: qty 1

## 2024-01-04 MED ORDER — OFLOXACIN 0.3 % OP SOLN: 1.0000 [drp] | Freq: Four times a day (QID) | OPHTHALMIC | 0 refills | Status: AC

## 2024-01-04 MED ORDER — TETRACAINE HCL 0.5 % OP SOLN
1.0000 [drp] | Freq: Once | OPHTHALMIC | Status: DC
  Filled 2024-01-04: qty 4
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
## 2024-01-04 NOTE — ED Notes (Signed)
 Visual acuity screening done by this EDT. Pt able to read the same line with both eyes, at 20/25. Right eye: 20/25 Left eye: 20/25

## 2024-01-04 NOTE — Discharge Instructions (Addendum)
 You can take Tylenol  and ibuprofen  as needed for pain.  Please use the antibiotic eyedrops as prescribed.  Follow-up with ophthalmology.  Their information is attached.

## 2024-01-04 NOTE — ED Notes (Signed)
 Medications given to Lauren PA-C
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
## 2024-02-05 ENCOUNTER — Other Ambulatory Visit: Payer: Self-pay

## 2024-02-05 ENCOUNTER — Emergency Department: Payer: Self-pay

## 2024-02-05 ENCOUNTER — Emergency Department
Admission: EM | Admit: 2024-02-05 | Discharge: 2024-02-05 | Disposition: A | Discharge status: Home / Self Care | Payer: Self-pay | Attending: Emergency Medicine | Admitting: Emergency Medicine

## 2024-02-05 DIAGNOSIS — M5442 Lumbago with sciatica, left side: Secondary | ICD-10-CM | POA: Insufficient documentation

## 2024-02-05 DIAGNOSIS — M545 Low back pain, unspecified: Secondary | ICD-10-CM

## 2024-02-05 MED ORDER — LIDOCAINE 5 % EX PTCH: 1.0000 | MEDICATED_PATCH | CUTANEOUS | 0 refills | Status: AC

## 2024-02-05 MED ORDER — METHOCARBAMOL 500 MG PO TABS
1000.0000 mg | ORAL_TABLET | Freq: Once | ORAL | Status: AC
  Administered 2024-02-05: 1000 mg via ORAL
  Filled 2024-02-05: qty 2

## 2024-02-05 MED ORDER — METHOCARBAMOL 500 MG PO TABS: 500.0000 mg | ORAL_TABLET | Freq: Three times a day (TID) | ORAL | 0 refills | Status: DC | PRN

## 2024-02-05 MED ORDER — LIDOCAINE 5 % EX PTCH
1.0000 | MEDICATED_PATCH | Freq: Once | CUTANEOUS | Status: DC
  Administered 2024-02-05: 1 via TRANSDERMAL
  Filled 2024-02-05: qty 1

## 2024-02-05 NOTE — ED Provider Notes (Signed)
 [facility] Provider Note    Event Date/Time   First MD Initiated Contact with Patient 02/05/24 Quin Brush     (approximate)   History   Back Pain   HPI Chaney Maclaren is a 38 y.o. male presenting today for low back pain.  Patient states over the past 6 months he has intermittent episodes where when he wakes up in the morning he experiences low back pain.  It tends to get better as the day goes on.  Denies any radiation of the pain.  No loss of bowel or bladder.  No numbness or weakness to his legs.  Denies any trauma.     Physical Exam   Triage Vital Signs: ED Triage Vitals  Encounter Vitals Group     BP 02/05/24 1619 122/77     Systolic BP Percentile --      Diastolic BP Percentile --      Pulse Rate 02/05/24 1619 77     Resp 02/05/24 1619 15     Temp 02/05/24 1619 98.7 F (37.1 C)     Temp Source 02/05/24 1619 Oral     SpO2 02/05/24 1619 95 %     Weight 02/05/24 1624 180 lb (81.6 kg)     Height 02/05/24 1624 5\' 6"  (1.676 m)     Head Circumference --      Peak Flow --      Pain Score 02/05/24 1623 5     Pain Loc --      Pain Education --      Exclude from Growth Chart --     Most recent vital signs: Vitals:   02/05/24 1619  BP: 122/77  Pulse: 77  Resp: 15  Temp: 98.7 F (37.1 C)  SpO2: 95%   I have reviewed the vital signs. General:  Awake, alert, no acute distress. Head:  Normocephalic, Atraumatic. EENT:  PERRL, EOMI, Oral mucosa pink and moist, Neck is supple. Cardiovascular: Regular rate, 2+ distal pulses. Respiratory:  Normal respiratory effort, symmetrical expansion, no distress.   Extremities:  Moving all four extremities through full ROM without pain.  Mild tenderness of the paraspinal lumbar muscles.  Negative straight leg raise test bilaterally.  No numbness or weakness to lower extremities. Neuro:  Alert and oriented.  Interacting appropriately.   Skin:  Warm, dry, no rash.   Psych: Appropriate affect.    ED Results  / Procedures / Treatments   Labs (all labs ordered are listed, but only abnormal results are displayed) Labs Reviewed - No data to display   EKG    RADIOLOGY Independently interpreted x-ray with no acute pathology   PROCEDURES:  Critical Care performed: No  Procedures   MEDICATIONS ORDERED IN ED: Medications  lidocaine  (LIDODERM ) 5 % 1 patch (has no administration in time range)  methocarbamol  (ROBAXIN ) tablet 1,000 mg (has no administration in time range)     IMPRESSION / MDM / ASSESSMENT AND PLAN / ED COURSE  I reviewed the triage vital signs and the nursing notes.                              Differential diagnosis includes, but is not limited to, lumbar strain, muscle spasms, sciatica  Patient's presentation is most consistent with acute complicated illness / injury requiring diagnostic workup.  Patient is a 38 year old male presenting today for acute on chronic low back pain.  Mild tenderness in the lumbar paraspinal  muscles.  No midline tenderness.  X-ray ordered in triage negative for acute pathology.  Negative sciatica testing bilaterally.  Suspect most likely muscle spasms versus muscle strain.  Otherwise safe for discharge with no red flags.  Will give Robaxin  and Lidoderm  and discharged on the same.  Given follow-up referral for PCP.     FINAL CLINICAL IMPRESSION(S) / ED DIAGNOSES   Final diagnoses:  Acute left-sided low back pain without sciatica     Rx / DC Orders   ED Discharge Orders          Ordered    methocarbamol  (ROBAXIN ) 500 MG tablet  Every 8 hours PRN        02/05/24 1902    lidocaine  (LIDODERM ) 5 %  Every 24 hours        02/05/24 1902    Ambulatory Referral to Primary Care (Establish Care)        02/05/24 1902             Note:  This document was prepared using Dragon voice recognition software and may include unintentional dictation errors.   Kandee Orion, MD 02/05/24 [phone]

## 2024-02-05 NOTE — Discharge Instructions (Addendum)
 I have sent medication to your pharmacy which you can use as needed.  Please follow-up with a primary care provider.  I have sent a referral but have also given paperwork to you that you can call to set up outpatient appointments.  They can see you outpatient for follow-up for this.
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
## 2024-07-04 ENCOUNTER — Emergency Department
Admission: EM | Admit: 2024-07-04 | Discharge: 2024-07-04 | Disposition: A | Discharge status: Home / Self Care | Payer: Self-pay | Attending: Emergency Medicine | Admitting: Emergency Medicine

## 2024-07-04 ENCOUNTER — Other Ambulatory Visit: Payer: Self-pay

## 2024-07-04 DIAGNOSIS — M545 Low back pain, unspecified: Secondary | ICD-10-CM | POA: Insufficient documentation

## 2024-07-04 LAB — URINALYSIS, ROUTINE W REFLEX MICROSCOPIC
Bilirubin Urine: NEGATIVE
Glucose, UA: NEGATIVE mg/dL
Hgb urine dipstick: NEGATIVE
Ketones, ur: NEGATIVE mg/dL
Leukocytes,Ua: NEGATIVE
Nitrite: NEGATIVE
Protein, ur: NEGATIVE mg/dL
Specific Gravity, Urine: 1.027 (ref 1.005–1.030)
pH: 5 (ref 5.0–8.0)

## 2024-07-04 MED ORDER — METHOCARBAMOL 500 MG PO TABS
500.0000 mg | ORAL_TABLET | Freq: Once | ORAL | Status: AC
  Administered 2024-07-04: 500 mg via ORAL
  Filled 2024-07-04: qty 1

## 2024-07-04 MED ORDER — LIDOCAINE 5 % EX PTCH: 1.0000 | MEDICATED_PATCH | CUTANEOUS | 0 refills | Status: AC

## 2024-07-04 MED ORDER — METHOCARBAMOL 500 MG PO TABS: 500.0000 mg | ORAL_TABLET | Freq: Three times a day (TID) | ORAL | 0 refills | Status: AC | PRN

## 2024-07-04 MED ORDER — KETOROLAC TROMETHAMINE 30 MG/ML IJ SOLN
30.0000 mg | Freq: Once | INTRAMUSCULAR | Status: AC
  Administered 2024-07-04: 30 mg via INTRAMUSCULAR
  Filled 2024-07-04: qty 1

## 2024-07-04 MED ORDER — MELOXICAM 15 MG PO TABS: 15.0000 mg | ORAL_TABLET | Freq: Every day | ORAL | 0 refills | Status: AC | PRN

## 2024-07-04 MED ORDER — LIDOCAINE 5 % EX PTCH
1.0000 | MEDICATED_PATCH | CUTANEOUS | Status: DC
  Administered 2024-07-04: 1 via TRANSDERMAL
  Filled 2024-07-04: qty 1

## 2024-07-04 NOTE — Discharge Instructions (Addendum)
 You were evaluated in the Emergency Department today for back pain. Your evaluation suggests no acute abnormalities which require further intervention at this time.   - Move around as tolerated but avoiding heavy lifting. "Bed rest" is not recommended nor is it the best treatment for low back pain.  -- Do not drink alcohol, drive a car, operate machinery, or get up on ladders or heights when taking any prescribed pain medications.  -- Do not drive home if you received prescribed pain medications here in the ED.  Please follow up with your primary care physician as needed or any other providers listed in this paperwork.  I will give you a referral to primary care.  Read through the resources attached and give one of their offices a call to schedule an appointment. I will also give you a referral to orthopedics to establish care and see if this warrants an MRI given you are having consistent pain.  You were prescribed Meloxicam  (antiinflammatory) and Methocarbamol  (muscle relaxer) to help with your pain.  Please take these medications only as prescribed. Please do not work, make legal-binding decisions, or operate a motor vehicle while taking the Methocarbamol .  Please do not take Ibuprofen , Aleve , Advil , Motrin , Naproxen , Aspirin, or any other non-steroidal antiinflammatory drug (NSAID) while taking the Meloxicam . Please stop taking the Meloxicam  if you experience any stomach cramping.  Return to the ED immediately if you develop any of the following problems: -- Leaking urine or difficulty urinating; -- Inability to control your bowels; -- New numbness or weakness in your legs or numbness between your legs; -- Inability to walk -- Fever

## 2024-07-04 NOTE — ED Provider Notes (Signed)
 [facility] Provider Note    Event Date/Time   First MD Initiated Contact with Patient 07/04/24 1840     (approximate)   History   Back Pain   HPI  Charles Montes is a 38 y.o. male  with a past medical history of left lower back pain presents to the emergency department with intermittent left lower back pain for a while.  Denies any fall or injury.  Denies urinary symptoms.  Pain does not go down his legs or radiate elsewhere.  Denies bowel or bladder incontinence, urinary retention, saddle anesthesia, recent weight loss, night sweats.  Denies nausea, vomiting, chest pain, abdominal pain, diarrhea. Patient reports his pain is worse when he wakes up in the morning and gets better throughout the day.  Patient states pain is reproducible with some motions.  Patient has not followed up with a PCP since his last visit to the ER for the same concern on 02/05/2024 where he was seen for intermittent left back pain for 6 months. He has not tried any medications at home recently. Has tried meloxicam  and muscle relaxers in the past with some relief in prior visits for back pain.   Physical Exam   Triage Vital Signs: ED Triage Vitals  Encounter Vitals Group     BP 07/04/24 1623 130/86     Girls Systolic BP Percentile --      Girls Diastolic BP Percentile --      Boys Systolic BP Percentile --      Boys Diastolic BP Percentile --      Pulse Rate 07/04/24 1623 88     Resp 07/04/24 1623 16     Temp 07/04/24 1623 98.6 F (37 C)     Temp Source 07/04/24 1623 Oral     SpO2 07/04/24 1623 95 %     Weight --      Height --      Head Circumference --      Peak Flow --      Pain Score 07/04/24 1624 6     Pain Loc --      Pain Education --      Exclude from Growth Chart --     Most recent vital signs: Vitals:   07/04/24 1623  BP: 130/86  Pulse: 88  Resp: 16  Temp: 98.6 F (37 C)  SpO2: 95%   General: Well-appearing, in no acute distress. Appears stated  age. Head: Normocephalic, atraumatic. CV: Regular rate, 88 bpm. Dorsalis pedis pulses 2+ bilaterally.  Respiratory: No wheezes, rales, or rhonchi. No respiratory distress. Normal respiratory effort. Skin:Warm, dry, intact.  Neurological: A&Ox4 to person, place, time, and situation. Sensation intact and equal to L4, L5, and S1.  GI: Soft, non-distended, non-tender. No rebound or guarding.  MSK: No spinal tenderness or paraspinal tenderness. Mild tenderness to left lower back in paraspinal lumbar region. Normal ROM with knee extension, dorsiflexion, and plantarflexion and 5/5 strength in bilateral lower extremities. No swelling or obvious deformities.  Straight leg raise negative b/l. No CVA tenderness bilaterally. Able to ambulate with normal gait pattern.   ED Results / Procedures / Treatments   Labs (all labs ordered are listed, but only abnormal results are displayed) Labs Reviewed  URINALYSIS, ROUTINE W REFLEX MICROSCOPIC - Abnormal; Notable for the following components:      Result Value   Color, Urine YELLOW (*)    APPearance CLEAR (*)    All other components within normal limits  EKG     RADIOLOGY    PROCEDURES:  Critical Care performed: No   Procedures   MEDICATIONS ORDERED IN ED: Medications  lidocaine  (LIDODERM ) 5 % 1 patch (1 patch Transdermal Patch Applied 07/04/24 1933)  ketorolac  (TORADOL ) 30 MG/ML injection 30 mg (30 mg Intramuscular Given 07/04/24 1933)  methocarbamol  (ROBAXIN ) tablet 500 mg (500 mg Oral Given 07/04/24 1933)     IMPRESSION / MDM / ASSESSMENT AND PLAN / ED COURSE  I reviewed the triage vital signs and the nursing notes.                              Differential diagnosis includes, but is not limited to, lumbar strain, muscle spasm, sciatica  Patient's presentation is most consistent with exacerbation of chronic illness.   Clinical Course as of 07/04/24 2018  Thu Jul 04, 2024  [id number] Patient with acute on chronic intermittent  left lower back pain worse in the morning and gets better throughout the day with movement.  Does not describe the pain as spasming.  No urinary symptoms.  No red flag symptoms of back pain.  Pain does not go down his legs in a sciatic formation. No midline spinal tenderness.  Vital signs stable.  UA ordered in triage and is negative for any findings.  He does not have any CVA tenderness on exam, weakness of his legs.  Will give him a Toradol  injection, lidocaine  patch and methocarbamol  and reevaluate his pain. [SD]  2016 Patient reports his pain is improved.  Will send meloxicam  and methocarbamol  prescriptions for him.  Discussed precautions regarding taking these medications.  Made referral to primary care to establish care primary care facility.  Also gave as needed referral to orthopedics to see if he can get seen more quickly and determine if further imaging such as an MRI is warranted of his back given it is consistently on his left side. [SD]    Clinical Course User Index [SD] Sheron Salm, PA-C   The patient may return to the emergency department for any new, worsening, or concerning symptoms. Patient was given the opportunity to ask questions; all questions were answered. Emergency department return precautions were discussed with the patient.  Patient is in agreement to the treatment plan.  Patient is stable for discharge.   FINAL CLINICAL IMPRESSION(S) / ED DIAGNOSES   Final diagnoses:  Acute left-sided low back pain without sciatica     Rx / DC Orders   ED Discharge Orders          Ordered    methocarbamol  (ROBAXIN ) 500 MG tablet  Every 8 hours PRN        07/04/24 2011    meloxicam  (MOBIC ) 15 MG tablet  Daily PRN        07/04/24 2011    lidocaine  (LIDODERM ) 5 %  Every 24 hours        07/04/24 2011    Ambulatory Referral to Primary Care (Establish Care)        07/04/24 2015             Note:  This document was prepared using Dragon voice recognition software and may  include unintentional dictation errors.     Sheron Salm, PA-C 07/04/24 2018    Ernest Ronal BRAVO, MD 07/08/24 [phone]

## 2024-07-04 NOTE — ED Triage Notes (Addendum)
 Pt c/o spasms in his lower back for awhile. Pt says he has not seen a doctor for it. Pt denies tingling or numbness or episodes of bowel/bladder incontinence. Pt does not recall a specific injury to his back but is concerned it may be his kidneys. Pt denies urinary sx. Pt says the pain is worse in the morning and gets better throughout the day.

## 2024-08-20 ENCOUNTER — Emergency Department: Payer: Self-pay

## 2024-08-20 ENCOUNTER — Other Ambulatory Visit: Payer: Self-pay

## 2024-08-20 ENCOUNTER — Inpatient Hospital Stay
Admission: EM | Admit: 2024-08-20 | Discharge: 2024-08-21 | Disposition: A | Discharge status: Home / Self Care | Payer: Self-pay | Attending: Internal Medicine | Admitting: Internal Medicine

## 2024-08-20 DIAGNOSIS — R0902 Hypoxemia: Principal | ICD-10-CM

## 2024-08-20 DIAGNOSIS — N179 Acute kidney failure, unspecified: Secondary | ICD-10-CM | POA: Diagnosis present

## 2024-08-20 DIAGNOSIS — R6520 Severe sepsis without septic shock: Secondary | ICD-10-CM | POA: Diagnosis present

## 2024-08-20 DIAGNOSIS — Z720 Tobacco use: Secondary | ICD-10-CM | POA: Diagnosis present

## 2024-08-20 DIAGNOSIS — Z1152 Encounter for screening for COVID-19: Secondary | ICD-10-CM

## 2024-08-20 DIAGNOSIS — A419 Sepsis, unspecified organism: Principal | ICD-10-CM | POA: Diagnosis present

## 2024-08-20 DIAGNOSIS — E663 Overweight: Secondary | ICD-10-CM | POA: Diagnosis present

## 2024-08-20 DIAGNOSIS — J9801 Acute bronchospasm: Secondary | ICD-10-CM | POA: Diagnosis present

## 2024-08-20 DIAGNOSIS — R7989 Other specified abnormal findings of blood chemistry: Secondary | ICD-10-CM | POA: Diagnosis present

## 2024-08-20 DIAGNOSIS — J9601 Acute respiratory failure with hypoxia: Secondary | ICD-10-CM | POA: Diagnosis present

## 2024-08-20 DIAGNOSIS — Z6828 Body mass index (BMI) 28.0-28.9, adult: Secondary | ICD-10-CM

## 2024-08-20 DIAGNOSIS — E860 Dehydration: Secondary | ICD-10-CM | POA: Diagnosis present

## 2024-08-20 DIAGNOSIS — R509 Fever, unspecified: Secondary | ICD-10-CM

## 2024-08-20 DIAGNOSIS — J189 Pneumonia, unspecified organism: Secondary | ICD-10-CM | POA: Diagnosis present

## 2024-08-20 DIAGNOSIS — J188 Other pneumonia, unspecified organism: Secondary | ICD-10-CM | POA: Diagnosis present

## 2024-08-20 DIAGNOSIS — F17210 Nicotine dependence, cigarettes, uncomplicated: Secondary | ICD-10-CM | POA: Diagnosis present

## 2024-08-20 HISTORY — DX: Tobacco use: Z72.0

## 2024-08-20 HISTORY — DX: Cannabis use, unspecified, uncomplicated: F12.90

## 2024-08-20 LAB — CBC WITH DIFFERENTIAL/PLATELET
Abs Immature Granulocytes: 0.02 K/uL (ref 0.00–0.07)
Basophils Absolute: 0 K/uL (ref 0.0–0.1)
Basophils Relative: 0 %
Eosinophils Absolute: 0 K/uL (ref 0.0–0.5)
Eosinophils Relative: 0 %
HCT: 43.1 % (ref 39.0–52.0)
Hemoglobin: 14.9 g/dL (ref 13.0–17.0)
Immature Granulocytes: 1 %
Lymphocytes Relative: 25 %
Lymphs Abs: 1 K/uL (ref 0.7–4.0)
MCH: 32.8 pg (ref 26.0–34.0)
MCHC: 34.6 g/dL (ref 30.0–36.0)
MCV: 94.9 fL (ref 80.0–100.0)
Monocytes Absolute: 0.5 K/uL (ref 0.1–1.0)
Monocytes Relative: 12 %
Neutro Abs: 2.5 K/uL (ref 1.7–7.7)
Neutrophils Relative %: 62 %
Platelets: 247 K/uL (ref 150–400)
RBC: 4.54 MIL/uL (ref 4.22–5.81)
RDW: 11.9 % (ref 11.5–15.5)
Smear Review: NORMAL
WBC: 4.1 K/uL (ref 4.0–10.5)
nRBC: 0 % (ref 0.0–0.2)

## 2024-08-20 LAB — COMPREHENSIVE METABOLIC PANEL WITH GFR
ALT: 51 U/L — ABNORMAL HIGH (ref 0–44)
AST: 120 U/L — ABNORMAL HIGH (ref 15–41)
Albumin: 4.3 g/dL (ref 3.5–5.0)
Alkaline Phosphatase: 62 U/L (ref 38–126)
Anion gap: 14 (ref 5–15)
BUN: 12 mg/dL (ref 6–20)
CO2: 23 mmol/L (ref 22–32)
Calcium: 8.6 mg/dL — ABNORMAL LOW (ref 8.9–10.3)
Chloride: 98 mmol/L (ref 98–111)
Creatinine, Ser: 1.68 mg/dL — ABNORMAL HIGH (ref 0.61–1.24)
GFR, Estimated: 53 mL/min — ABNORMAL LOW
Glucose, Bld: 92 mg/dL (ref 70–99)
Potassium: 4 mmol/L (ref 3.5–5.1)
Sodium: 134 mmol/L — ABNORMAL LOW (ref 135–145)
Total Bilirubin: 0.4 mg/dL (ref 0.0–1.2)
Total Protein: 7.6 g/dL (ref 6.5–8.1)

## 2024-08-20 LAB — RESP PANEL BY RT-PCR (RSV, FLU A&B, COVID)  RVPGX2
Influenza A by PCR: NEGATIVE
Influenza B by PCR: NEGATIVE
Resp Syncytial Virus by PCR: NEGATIVE
SARS Coronavirus 2 by RT PCR: NEGATIVE

## 2024-08-20 LAB — LACTIC ACID, PLASMA: Lactic Acid, Venous: 0.9 mmol/L (ref 0.5–1.9)

## 2024-08-20 MED ORDER — SODIUM CHLORIDE 0.9 % IV BOLUS
1000.0000 mL | Freq: Once | INTRAVENOUS | Status: AC
  Administered 2024-08-20: 1000 mL via INTRAVENOUS

## 2024-08-20 MED ORDER — ALBUTEROL SULFATE (2.5 MG/3ML) 0.083% IN NEBU
2.5000 mg | INHALATION_SOLUTION | Freq: Once | RESPIRATORY_TRACT | Status: AC
  Administered 2024-08-20: 2.5 mg via RESPIRATORY_TRACT
  Filled 2024-08-20: qty 3

## 2024-08-20 MED ORDER — IOHEXOL 350 MG/ML SOLN
75.0000 mL | Freq: Once | INTRAVENOUS | Status: AC | PRN
  Administered 2024-08-20: 75 mL via INTRAVENOUS

## 2024-08-20 MED ORDER — ACETAMINOPHEN 325 MG PO TABS
650.0000 mg | ORAL_TABLET | Freq: Once | ORAL | Status: AC
  Administered 2024-08-20: 650 mg via ORAL
  Filled 2024-08-20: qty 2

## 2024-08-20 MED ORDER — PREDNISONE 20 MG PO TABS
60.0000 mg | ORAL_TABLET | Freq: Once | ORAL | Status: AC
  Administered 2024-08-20: 60 mg via ORAL
  Filled 2024-08-20: qty 3

## 2024-08-20 MED ORDER — IPRATROPIUM-ALBUTEROL 0.5-2.5 (3) MG/3ML IN SOLN
3.0000 mL | Freq: Once | RESPIRATORY_TRACT | Status: AC
  Administered 2024-08-20: 3 mL via RESPIRATORY_TRACT
  Filled 2024-08-20: qty 3

## 2024-08-20 MED ORDER — IBUPROFEN 800 MG PO TABS
800.0000 mg | ORAL_TABLET | Freq: Once | ORAL | Status: AC
  Administered 2024-08-20: 800 mg via ORAL
  Filled 2024-08-20: qty 1

## 2024-08-20 MED ORDER — METHYLPREDNISOLONE SODIUM SUCC 125 MG IJ SOLR
125.0000 mg | Freq: Once | INTRAMUSCULAR | Status: AC
  Administered 2024-08-20: 125 mg via INTRAVENOUS
  Filled 2024-08-20: qty 2

## 2024-08-20 NOTE — ED Triage Notes (Signed)
 Pt comes with flu like symptoms since Friday

## 2024-08-20 NOTE — ED Provider Notes (Signed)
 "  [facility] Provider Note    Event Date/Time   First MD Initiated Contact with Patient 08/20/24 1930     (approximate)   History   Fever   HPI  Charles Montes is a 38 y.o. male with no significant past medical history and as listed in EMR presents to the emergency department for evaluation of body aches, chills, fever, headache for the past 4 days.  No known exposures to anyone with COVID or flu.SABRA   Physical Exam    Vitals:   08/20/24 1937 08/20/24 2215  BP:  120/65  Pulse:  84  Resp:  17  Temp:    SpO2: 91% 90%    General: Awake, no distress.  CV:  Good peripheral perfusion.  Resp:  Normal effort.  Breath sounds are diminished Abd:  No distention.  Other:     ED Results / Procedures / Treatments   Labs (all labs ordered are listed, but only abnormal results are displayed)  Labs Reviewed  COMPREHENSIVE METABOLIC PANEL WITH GFR - Abnormal; Notable for the following components:      Result Value   Sodium 134 (*)    Creatinine, Ser 1.68 (*)    Calcium 8.6 (*)    AST 120 (*)    ALT 51 (*)    GFR, Estimated 53 (*)    All other components within normal limits  RESP PANEL BY RT-PCR (RSV, FLU A&B, COVID)  [id number]  CBC WITH DIFFERENTIAL/PLATELET  LACTIC ACID, PLASMA     EKG  Not indicated   RADIOLOGY  Image and radiology report reviewed and interpreted by me. Radiology report consistent with the same.  Chest x-ray negative for acute cardiopulmonary abnormality.  PROCEDURES:  Critical Care performed: No  Procedures   MEDICATIONS ORDERED IN ED:  Medications  iohexol  (OMNIPAQUE ) 350 MG/ML injection 75 mL (has no administration in time range)  acetaminophen  (TYLENOL ) tablet 650 mg (650 mg Oral Given 08/20/24 1654)  ibuprofen  (ADVIL ) tablet 800 mg (800 mg Oral Given 08/20/24 1810)  sodium chloride  0.9 % bolus 1,000 mL (0 mLs Intravenous Stopped 08/20/24 2156)  ipratropium-albuterol  (DUONEB) 0.5-2.5 (3) MG/3ML  nebulizer solution 3 mL (3 mLs Nebulization Given 08/20/24 2022)  methylPREDNISolone  sodium succinate (SOLU-MEDROL ) 125 mg/2 mL injection 125 mg (125 mg Intravenous Given 08/20/24 2018)  albuterol  (PROVENTIL ) (2.5 MG/3ML) 0.083% nebulizer solution 2.5 mg (2.5 mg Nebulization Given 08/20/24 2156)  albuterol  (PROVENTIL ) (2.5 MG/3ML) 0.083% nebulizer solution 2.5 mg (2.5 mg Nebulization Given 08/20/24 2329)  predniSONE  (DELTASONE ) tablet 60 mg (60 mg Oral Given 08/20/24 2329)     IMPRESSION / MDM / ASSESSMENT AND PLAN / ED COURSE   I have reviewed the triage note and vital signs. Vital signs febrile and tachycardic   Differential diagnosis includes, but is not limited to, COVID, influenza, RSV, viral syndrome, pneumonia  Patient's presentation is most consistent with acute complicated illness / injury requiring diagnostic workup.  38 year old male presenting to the emergency department for treatment and evaluation of flulike symptoms.  See HPI for further details.  Clinical Course as of 08/20/24 2339  Tue Aug 20, 2024  2159 CBC is unremarkable.  Lactic acid is not elevated.  CMP shows sodium of 134 with a BUN of 12 and a creatinine of 1.68.  AST is 120 with an ALT of 51.  Normal alkaline phosphatase.  GFR is 53.  While here, IV fluids were administered which should help resolve mild dehydration.  Have no comparison for the AST  or ALT.  Patient has no abdominal pain. He will need to have labs rechecked outpatient.  I had planned for discharge, however after taking the oxygen off SpO2 dropped to 87% on room air.  Second breathing treatment ordered. [CT]  2337 Oxygen removed and patient decided to 87% on room air just afterward.  Plan will be to give him prednisone , third breathing treatment and get a CTA chest to rule out PE or other underlying acute concerns.  Patient does report smoking cigarettes but denies any history of asthma or other lung conditions.  Care will be transitioned to oncoming ED  attending and patient will be moved to room 25. [CT]    Clinical Course User Index [CT] Kona Lover B, FNP     FINAL CLINICAL IMPRESSION(S) / ED DIAGNOSES   Final diagnoses:  Hypoxia  Febrile illness, acute     Rx / DC Orders   ED Discharge Orders     None        Note:  This document was prepared using Dragon voice recognition software and may include unintentional dictation errors.   Herlinda Kirk NOVAK, FNP 08/20/24 2339    Waymond Lorelle Cummins, MD 08/22/24 [phone]  "

## 2024-08-21 ENCOUNTER — Encounter: Payer: Self-pay | Admitting: Internal Medicine

## 2024-08-21 DIAGNOSIS — R7989 Other specified abnormal findings of blood chemistry: Secondary | ICD-10-CM

## 2024-08-21 DIAGNOSIS — Z720 Tobacco use: Secondary | ICD-10-CM

## 2024-08-21 DIAGNOSIS — E663 Overweight: Secondary | ICD-10-CM

## 2024-08-21 DIAGNOSIS — J9601 Acute respiratory failure with hypoxia: Secondary | ICD-10-CM

## 2024-08-21 DIAGNOSIS — J188 Other pneumonia, unspecified organism: Secondary | ICD-10-CM

## 2024-08-21 DIAGNOSIS — N179 Acute kidney failure, unspecified: Secondary | ICD-10-CM | POA: Diagnosis present

## 2024-08-21 DIAGNOSIS — A419 Sepsis, unspecified organism: Secondary | ICD-10-CM

## 2024-08-21 LAB — BLOOD GAS, VENOUS
Acid-base deficit: 3.5 mmol/L — ABNORMAL HIGH (ref 0.0–2.0)
Bicarbonate: 22.7 mmol/L (ref 20.0–28.0)
O2 Saturation: 97.7 %
Patient temperature: 37
pCO2, Ven: 44 mmHg (ref 44–60)
pH, Ven: 7.32 (ref 7.25–7.43)
pO2, Ven: 80 mmHg — ABNORMAL HIGH (ref 32–45)

## 2024-08-21 LAB — HEPATITIS PANEL, ACUTE
HCV Ab: NONREACTIVE
Hep A IgM: NONREACTIVE
Hep B C IgM: NONREACTIVE
Hepatitis B Surface Ag: NONREACTIVE

## 2024-08-21 LAB — CBC
HCT: 42.3 % (ref 39.0–52.0)
Hemoglobin: 14.4 g/dL (ref 13.0–17.0)
MCH: 32.7 pg (ref 26.0–34.0)
MCHC: 34 g/dL (ref 30.0–36.0)
MCV: 96.1 fL (ref 80.0–100.0)
Platelets: 265 K/uL (ref 150–400)
RBC: 4.4 MIL/uL (ref 4.22–5.81)
RDW: 11.9 % (ref 11.5–15.5)
WBC: 3.4 K/uL — ABNORMAL LOW (ref 4.0–10.5)
nRBC: 0.6 % — ABNORMAL HIGH (ref 0.0–0.2)

## 2024-08-21 LAB — RESPIRATORY PANEL BY PCR

## 2024-08-21 LAB — COMPREHENSIVE METABOLIC PANEL WITH GFR
ALT: 48 U/L — ABNORMAL HIGH (ref 0–44)
AST: 105 U/L — ABNORMAL HIGH (ref 15–41)
Albumin: 3.9 g/dL (ref 3.5–5.0)
Alkaline Phosphatase: 57 U/L (ref 38–126)
Anion gap: 13 (ref 5–15)
BUN: 13 mg/dL (ref 6–20)
CO2: 21 mmol/L — ABNORMAL LOW (ref 22–32)
Calcium: 8.2 mg/dL — ABNORMAL LOW (ref 8.9–10.3)
Chloride: 101 mmol/L (ref 98–111)
Creatinine, Ser: 1.27 mg/dL — ABNORMAL HIGH (ref 0.61–1.24)
GFR, Estimated: >60 mL/min
Glucose, Bld: 179 mg/dL — ABNORMAL HIGH (ref 70–99)
Potassium: 4.4 mmol/L (ref 3.5–5.1)
Sodium: 135 mmol/L (ref 135–145)
Total Bilirubin: 0.3 mg/dL (ref 0.0–1.2)
Total Protein: 7.2 g/dL (ref 6.5–8.1)

## 2024-08-21 LAB — HIV ANTIBODY (ROUTINE TESTING W REFLEX): HIV Screen 4th Generation wRfx: NONREACTIVE

## 2024-08-21 LAB — STREP PNEUMONIAE URINARY ANTIGEN: Strep Pneumo Urinary Antigen: NEGATIVE

## 2024-08-21 MED ORDER — SODIUM CHLORIDE 0.9 % IV SOLN: INTRAVENOUS | Status: DC

## 2024-08-21 MED ORDER — ALBUTEROL SULFATE (2.5 MG/3ML) 0.083% IN NEBU: 2.5000 mg | INHALATION_SOLUTION | RESPIRATORY_TRACT | Status: DC | PRN

## 2024-08-21 MED ORDER — IPRATROPIUM-ALBUTEROL 0.5-2.5 (3) MG/3ML IN SOLN
3.0000 mL | RESPIRATORY_TRACT | Status: DC
  Administered 2024-08-21 (×2): 3 mL via RESPIRATORY_TRACT
  Filled 2024-08-21 (×3): qty 3

## 2024-08-21 MED ORDER — DM-GUAIFENESIN ER 30-600 MG PO TB12: 1.0000 | ORAL_TABLET | Freq: Two times a day (BID) | ORAL | Status: DC | PRN

## 2024-08-21 MED ORDER — ENOXAPARIN SODIUM 40 MG/0.4ML IJ SOSY
40.0000 mg | PREFILLED_SYRINGE | INTRAMUSCULAR | Status: DC
  Administered 2024-08-21: 40 mg via SUBCUTANEOUS
  Filled 2024-08-21: qty 0.4

## 2024-08-21 MED ORDER — AMOXICILLIN-POT CLAVULANATE 875-125 MG PO TABS: 1.0000 | ORAL_TABLET | Freq: Two times a day (BID) | ORAL | 0 refills | Status: AC

## 2024-08-21 MED ORDER — ACETAMINOPHEN 325 MG PO TABS: 650.0000 mg | ORAL_TABLET | Freq: Four times a day (QID) | ORAL | Status: DC | PRN

## 2024-08-21 MED ORDER — SODIUM CHLORIDE 0.9 % IV SOLN
1.0000 g | Freq: Once | INTRAVENOUS | Status: AC
  Administered 2024-08-21: 1 g via INTRAVENOUS
  Filled 2024-08-21: qty 10

## 2024-08-21 MED ORDER — ALBUTEROL SULFATE HFA 108 (90 BASE) MCG/ACT IN AERS: 2.0000 | INHALATION_SPRAY | RESPIRATORY_TRACT | Status: DC | PRN

## 2024-08-21 MED ORDER — METHYLPREDNISOLONE SODIUM SUCC 125 MG IJ SOLR
80.0000 mg | INTRAMUSCULAR | Status: DC
  Administered 2024-08-21: 80 mg via INTRAVENOUS
  Filled 2024-08-21: qty 2

## 2024-08-21 MED ORDER — IPRATROPIUM-ALBUTEROL 0.5-2.5 (3) MG/3ML IN SOLN: 3.0000 mL | Freq: Once | RESPIRATORY_TRACT | Status: DC

## 2024-08-21 MED ORDER — ONDANSETRON HCL 4 MG/2ML IJ SOLN: 4.0000 mg | Freq: Three times a day (TID) | INTRAMUSCULAR | Status: DC | PRN

## 2024-08-21 MED ORDER — DM-GUAIFENESIN ER 30-600 MG PO TB12: 1.0000 | ORAL_TABLET | Freq: Two times a day (BID) | ORAL | 0 refills | Status: AC

## 2024-08-21 MED ORDER — NICOTINE 21 MG/24HR TD PT24
21.0000 mg | MEDICATED_PATCH | Freq: Every day | TRANSDERMAL | Status: DC
  Administered 2024-08-21: 21 mg via TRANSDERMAL
  Filled 2024-08-21: qty 1

## 2024-08-21 MED ORDER — SODIUM CHLORIDE 0.9 % IV SOLN
500.0000 mg | Freq: Once | INTRAVENOUS | Status: DC
  Filled 2024-08-21: qty 5

## 2024-08-21 MED ORDER — SODIUM CHLORIDE 0.9 % IV SOLN
500.0000 mg | INTRAVENOUS | Status: DC
  Administered 2024-08-21: 500 mg via INTRAVENOUS

## 2024-08-21 MED ORDER — SODIUM CHLORIDE 0.9 % IV SOLN
1.0000 g | INTRAVENOUS | Status: DC
  Administered 2024-08-21: 1 g via INTRAVENOUS
  Filled 2024-08-21: qty 10

## 2024-08-21 NOTE — Discharge Instructions (Signed)
 Follow up with PCP in 1-2 weeks

## 2024-08-21 NOTE — Progress Notes (Signed)
 Patient ambulated to restroom and back with NAD, denies SOB or chest pain. O2 checked on return to bed and noted to be 96 on RA. Pt educated to call if symptoms return. Pt verbalizes understanding. NAD, Call bell in reach

## 2024-08-21 NOTE — Discharge Summary (Signed)
 " Physician Discharge Summary   Patient: Charles Montes MRN: [id number] DOB: [birth year]  Admit date:     08/20/2024  Discharge date: 08/21/2024  Discharge Physician: Drue ONEIDA Potter   PCP: Patient, No Pcp Per   Recommendations at discharge:  Follow-up with PCP  Discharge Diagnoses:  Acute respiratory failure with hypoxia and sepsis due to Multifocal pneumonia:  AKI (acute kidney injury): Abnormal LFTs: Likely due to sepsis. Tobacco abuse Overweight (BMI 25.0-29.9):     Hospital Course: Charles Montes is a 38 y.o. male with medical history significant of tobacco abuse, marijuana use, who presents with fever, chills, cough, chest pain.   Patient states that he has been sick in the past 5 days.  Symptoms include fever, chills, dry cough, body aches, headache, mild pleuritic front chest pain.  No nausea, vomiting, diarrhea or abdominal pain.  Denies symptoms of UTI.  Patient does not have shortness of breath.  He is not using oxygen normally.  He is found to have oxygen desaturation to 87% on room air, which improved to 91% on 3 L oxygen in the ED. Patient with findings of bilateral pneumonia. Has been weaned off oxygen Doing much better walking about with no complaints. Patient therefore being discharged with oral antibiotics and will follow-up with PCP.    Consultants: None Procedures performed: None Disposition: Home Diet recommendation:  Cardiac diet DISCHARGE MEDICATION: Allergies as of 08/21/2024   No Known Allergies      Medication List     STOP taking these medications    mupirocin  ointment 2 % Commonly known as: BACTROBAN    predniSONE  50 MG tablet Commonly known as: DELTASONE        TAKE these medications    albuterol  108 (90 Base) MCG/ACT inhaler Commonly known as: VENTOLIN  HFA Inhale 2 puffs into the lungs every 6 (six) hours as needed for wheezing or shortness of breath.   amoxicillin -clavulanate 875-125 MG tablet Commonly known as:  AUGMENTIN  Take 1 tablet by mouth 2 (two) times daily for 7 days.   dextromethorphan-guaiFENesin  30-600 MG 12hr tablet Commonly known as: MUCINEX  DM Take 1 tablet by mouth 2 (two) times daily for 7 days.        Discharge Exam: Filed Weights   08/20/24 1649  Weight: 81.6 kg    General: Not in acute distress Heme: No neck lymph node enlargement. Cardiac: S1/S2, RRR, No murmurs, No gallops or rubs. Respiratory: Clear to auscultation GI: Soft, nondistended, nontender, GU: No hematuria Ext: No pitting leg edema bilaterally. 1+DP/PT pulse bilaterally. Musculoskeletal: No joint deformities, No joint redness or warmth, no limitation of ROM in spin. Skin: No rashes.  Neuro: Alert, oriented X3, cranial nerves II-XII grossly intact, moves all extremities normally. Psych: Patient is not psychotic, no suicidal or hemocidal ideation. Condition at discharge: good  The results of significant diagnostics from this hospitalization (including imaging, microbiology, ancillary and laboratory) are listed below for reference.   Imaging Studies: CT Angio Chest PE W and/or Wo Contrast Result Date: 08/21/2024 CLINICAL DATA:  Hypoxia.  Shortness of breath. EXAM: CT ANGIOGRAPHY CHEST WITH CONTRAST TECHNIQUE: Multidetector CT imaging of the chest was performed using the standard protocol during bolus administration of intravenous contrast. Multiplanar CT image reconstructions and MIPs were obtained to evaluate the vascular anatomy. RADIATION DOSE REDUCTION: This exam was performed according to the departmental dose-optimization program which includes automated exposure control, adjustment of the mA and/or kV according to patient size and/or use of iterative reconstruction technique. CONTRAST:  75mL OMNIPAQUE  IOHEXOL   350 MG/ML SOLN COMPARISON:  Radiograph earlier today FINDINGS: Cardiovascular: There are no filling defects within the pulmonary arteries to suggest pulmonary embolus. Normal caliber thoracic aorta.  The heart is normal in size. No pericardial effusion. Mediastinum/Nodes: Subcarinal and right hilar adenopathy. No visible thyroid nodule. Tiny hiatal hernia. The esophagus is decompressed. Lungs/Pleura: Multifocal ground-glass and consolidative opacities throughout both lungs. This involves the right upper, right middle and both lower lobes. Additionally there is mild diffuse tree-in-bud opacities. Moderate bronchial thickening. No pleural effusion Upper Abdomen: No acute upper abdominal findings. Musculoskeletal: There are no acute or suspicious osseous abnormalities. Review of the MIP images confirms the above findings. IMPRESSION: 1. No pulmonary embolus. 2. Multifocal ground-glass and consolidative opacities throughout both lungs, consistent with multifocal pneumonia. Bronchial thickening. 3. Subcarinal and right hilar adenopathy is likely reactive. Electronically Signed   By: Andrea Gasman M.D.   On: 08/21/2024 00:02   DG Chest Portable 1 View Result Date: 08/20/2024 CLINICAL DATA:  Cough and fever EXAM: PORTABLE CHEST 1 VIEW COMPARISON:  11/11/2020 FINDINGS: The heart size and mediastinal contours are within normal limits. Both lungs are clear. Old fracture deformity of the right clavicle IMPRESSION: No active disease. Electronically Signed   By: Luke Bun M.D.   On: 08/20/2024 20:01    Microbiology: Results for orders placed or performed during the hospital encounter of 08/20/24  Resp panel by RT-PCR (RSV, Flu A&B, Covid) Anterior Nasal Swab     Status: None   Collection Time: 08/20/24  4:50 PM   Specimen: Anterior Nasal Swab  Result Value Ref Range Status   SARS Coronavirus 2 by RT PCR NEGATIVE NEGATIVE Final    Comment: (NOTE) SARS-CoV-2 target nucleic acids are NOT DETECTED.  The SARS-CoV-2 RNA is generally detectable in upper respiratory specimens during the acute phase of infection. The lowest concentration of SARS-CoV-2 viral copies this assay can detect is 138 copies/mL. A  negative result does not preclude SARS-Cov-2 infection and should not be used as the sole basis for treatment or other patient management decisions. A negative result may occur with  improper specimen collection/handling, submission of specimen other than nasopharyngeal swab, presence of viral mutation(s) within the areas targeted by this assay, and inadequate number of viral copies(<138 copies/mL). A negative result must be combined with clinical observations, patient history, and epidemiological information. The expected result is Negative.  Fact Sheet for Patients:  bloggercourse.com  Fact Sheet for Healthcare Providers:  seriousbroker.it  This test is no t yet approved or cleared by the [place]  FDA and  has been authorized for detection and/or diagnosis of SARS-CoV-2 by FDA under an Emergency Use Authorization (EUA). This EUA will remain  in effect (meaning this test can be used) for the duration of the COVID-19 declaration under Section 564(b)(1) of the Act, 21 U.S.C.section 360bbb-3(b)(1), unless the authorization is terminated  or revoked sooner.       Influenza A by PCR NEGATIVE NEGATIVE Final   Influenza B by PCR NEGATIVE NEGATIVE Final    Comment: (NOTE) The Xpert Xpress SARS-CoV-2/FLU/RSV plus assay is intended as an aid in the diagnosis of influenza from Nasopharyngeal swab specimens and should not be used as a sole basis for treatment. Nasal washings and aspirates are unacceptable for Xpert Xpress SARS-CoV-2/FLU/RSV testing.  Fact Sheet for Patients: bloggercourse.com  Fact Sheet for Healthcare Providers: seriousbroker.it  This test is not yet approved or cleared by the [place]  FDA and has been authorized for detection and/or diagnosis of SARS-CoV-2 by  FDA under an Emergency Use Authorization (EUA). This EUA will remain in effect (meaning this test can  be used) for the duration of the COVID-19 declaration under Section 564(b)(1) of the Act, 21 U.S.C. section 360bbb-3(b)(1), unless the authorization is terminated or revoked.     Resp Syncytial Virus by PCR NEGATIVE NEGATIVE Final    Comment: (NOTE) Fact Sheet for Patients: bloggercourse.com  Fact Sheet for Healthcare Providers: seriousbroker.it  This test is not yet approved or cleared by the [place]  FDA and has been authorized for detection and/or diagnosis of SARS-CoV-2 by FDA under an Emergency Use Authorization (EUA). This EUA will remain in effect (meaning this test can be used) for the duration of the COVID-19 declaration under Section 564(b)(1) of the Act, 21 U.S.C. section 360bbb-3(b)(1), unless the authorization is terminated or revoked.  Performed at [facility], [address] [place], [place] [address]   Blood culture (routine x 2)     Status: None (Preliminary result)   Collection Time: 08/20/24 11:00 PM   Specimen: BLOOD  Result Value Ref Range Status   Specimen Description BLOOD BLOOD RIGHT ARM  Final   Special Requests   Final    BOTTLES DRAWN AEROBIC AND ANAEROBIC Blood Culture results may not be optimal due to an inadequate volume of blood received in culture bottles   Culture   Final    NO GROWTH < 12 HOURS Performed at [facility], [address] [place], [place] [address]    Report Status PENDING  Incomplete  Blood culture (routine x 2)     Status: None (Preliminary result)   Collection Time: 08/21/24  1:16 AM   Specimen: BLOOD  Result Value Ref Range Status   Specimen Description BLOOD BLOOD LEFT ARM  Final   Special Requests   Final    BOTTLES DRAWN AEROBIC AND ANAEROBIC Blood Culture adequate volume   Culture   Final    NO GROWTH < 12 HOURS Performed at [facility], [address] [place], [place] [address]    Report Status PENDING  Incomplete     Labs: CBC: Recent Labs  Lab 08/20/24 2002 08/21/24 0500  WBC 4.1 3.4*  NEUTROABS 2.5  --   HGB 14.9 14.4  HCT 43.1 42.3  MCV 94.9 96.1  PLT 247 265   Basic Metabolic Panel: Recent Labs  Lab 08/20/24 2002 08/21/24 0500  NA 134* 135  K 4.0 4.4  CL 98 101  CO2 23 21*  GLUCOSE 92 179*  BUN 12 13  CREATININE 1.68* 1.27*  CALCIUM 8.6* 8.2*   Liver Function Tests: Recent Labs  Lab 08/20/24 2002 08/21/24 0500  AST 120* 105*  ALT 51* 48*  ALKPHOS 62 57  BILITOT 0.4 0.3  PROT 7.6 7.2  ALBUMIN 4.3 3.9   CBG: No results for input(s): GLUCAP in the last 168 hours.  Discharge time spent:  35 minutes.  Signed: Drue ONEIDA Potter, MD [facility] 08/21/2024 "

## 2024-08-21 NOTE — ED Notes (Signed)
 Patient discharged and given paperwork for DC and work note. IV's removed. Denies needs.

## 2024-08-21 NOTE — Progress Notes (Signed)
 Received request from patient's mother to ask him to call her. Notified patient. Pt acknowledged request and states he is sleepy

## 2024-08-21 NOTE — H&P (Signed)
 " History and Physical    Charles Montes FMW:[id number] DOB: [birth year] DOA: 08/20/2024  Referring MD/NP/PA:   PCP: Patient, No Pcp Per   Patient coming from:  The patient is coming from home.     Chief Complaint: fever, chills, cough, chest pain  HPI: Charles Montes is a 38 y.o. male with medical history significant of tobacco abuse, marijuana use, who presents with fever, chills, cough, chest pain.  Patient states that he has been sick in the past 5 days.  Symptoms include fever, chills, dry cough, body aches, headache, mild pleuritic front chest pain.  No nausea, vomiting, diarrhea or abdominal pain.  Denies symptoms of UTI.  Patient does not have shortness of breath.  He is not using oxygen normally.  He is found to have oxygen desaturation to 87% on room air, which improved to 91% on 3 L oxygen in the ED.   Data reviewed independently and ED Course: pt was found to have negative PCR for COVID, flu and RSV, WBC 4.0, AKI, lactic acid 0.9, abnormal liver function (ALP 62, AST 120, ALT 51, total bilirubin 0.4).  Temperature 103.1, blood pressure 97/78, heart rate 115, RR 17.  Chest x-ray negative.  CTA negative for PE but showed multifocal infiltration.  Patient is admitted to PCU as inpatient.  CTA: 1. No pulmonary embolus. 2. Multifocal ground-glass and consolidative opacities throughout both lungs, consistent with multifocal pneumonia. Bronchial thickening. 3. Subcarinal and right hilar adenopathy is likely reactive.   EKG:  Not done in ED, will get one.     Review of Systems:   General: has fevers, chills, no body weight gain, has fatigue, HA HEENT: no blurry vision, hearing changes or sore throat Respiratory: no dyspnea, has coughing, wheezing CV: has chest pain, no palpitations GI: no nausea, vomiting, abdominal pain, diarrhea, constipation GU: no dysuria, burning on urination, increased urinary frequency, hematuria  Ext: no leg edema Neuro: no unilateral  weakness, numbness, or tingling, no vision change or hearing loss Skin: no rash, no skin tear. MSK: No muscle spasm, no deformity, no limitation of range of movement in spin Heme: No easy bruising.  Travel history: No recent long distant travel.   Allergy: Allergies[1]  Past Medical History:  Diagnosis Date   Tobacco abuse     History reviewed. No pertinent surgical history.  Social History:  reports that he has been smoking cigarettes. He has never used smokeless tobacco. He reports current alcohol use of about 8.0 standard drinks of alcohol per week. He reports current drug use. Drug: Marijuana.  Family History: History reviewed. No pertinent family history.  I have reviewed with patient about family medical history, but patient states that there is no significant family medical history to report.   Prior to Admission medications  Medication Sig Start Date End Date Taking? Authorizing Provider  albuterol  (VENTOLIN  HFA) 108 (90 Base) MCG/ACT inhaler Inhale 2 puffs into the lungs every 6 (six) hours as needed for wheezing or shortness of breath. 11/14/23   Evans, Alexandra, PA-C  mupirocin  ointment (BACTROBAN ) 2 % Apply 1 Application topically 2 (two) times daily. 12/19/22   Poggi, Jenna E, PA-C  predniSONE  (DELTASONE ) 50 MG tablet Please take the pill with breakfast 11/14/23   Janit Kast, PA-C    Physical Exam: Vitals:   08/20/24 1929 08/20/24 1937 08/20/24 1937 08/20/24 2215  BP: 110/74   120/65  Pulse: 90   84  Resp: 18   17  Temp: 98.4 F (36.9 C)  TempSrc: Oral     SpO2: 91% 91% 91% 90%  Weight:      Height:       General: Not in acute distress HEENT:       Eyes: PERRL, EOMI, no jaundice       ENT: No discharge from the ears and nose, no pharynx injection, no tonsillar enlargement.        Neck: No JVD, no bruit, no mass felt. Heme: No neck lymph node enlargement. Cardiac: S1/S2, RRR, No murmurs, No gallops or rubs. Respiratory: has wheezing and crackles  bilaterally GI: Soft, nondistended, nontender, no rebound pain, no organomegaly, BS present. GU: No hematuria Ext: No pitting leg edema bilaterally. 1+DP/PT pulse bilaterally. Musculoskeletal: No joint deformities, No joint redness or warmth, no limitation of ROM in spin. Skin: No rashes.  Neuro: Alert, oriented X3, cranial nerves II-XII grossly intact, moves all extremities normally. Psych: Patient is not psychotic, no suicidal or hemocidal ideation.  Labs on Admission: I have personally reviewed following labs and imaging studies  CBC: Recent Labs  Lab 08/20/24 2002  WBC 4.1  NEUTROABS 2.5  HGB 14.9  HCT 43.1  MCV 94.9  PLT 247   Basic Metabolic Panel: Recent Labs  Lab 08/20/24 2002  NA 134*  K 4.0  CL 98  CO2 23  GLUCOSE 92  BUN 12  CREATININE 1.68*  CALCIUM 8.6*   GFR: Estimated Creatinine Clearance: 60.4 mL/min (A) (by C-G formula based on SCr of 1.68 mg/dL (H)). Liver Function Tests: Recent Labs  Lab 08/20/24 2002  AST 120*  ALT 51*  ALKPHOS 62  BILITOT 0.4  PROT 7.6  ALBUMIN 4.3   No results for input(s): LIPASE, AMYLASE in the last 168 hours. No results for input(s): AMMONIA in the last 168 hours. Coagulation Profile: No results for input(s): INR, PROTIME in the last 168 hours. Cardiac Enzymes: No results for input(s): CKTOTAL, CKMB, CKMBINDEX, TROPONINI in the last 168 hours. BNP (last 3 results) No results for input(s): PROBNP in the last 8760 hours. HbA1C: No results for input(s): HGBA1C in the last 72 hours. CBG: No results for input(s): GLUCAP in the last 168 hours. Lipid Profile: No results for input(s): CHOL, HDL, LDLCALC, TRIG, CHOLHDL, LDLDIRECT in the last 72 hours. Thyroid Function Tests: No results for input(s): TSH, T4TOTAL, FREET4, T3FREE, THYROIDAB in the last 72 hours. Anemia Panel: No results for input(s): VITAMINB12, FOLATE, FERRITIN, TIBC, IRON, RETICCTPCT in the last  72 hours. Urine analysis:    Component Value Date/Time   COLORURINE YELLOW (A) 07/04/2024 1626   APPEARANCEUR CLEAR (A) 07/04/2024 1626   LABSPEC 1.027 07/04/2024 1626   PHURINE 5.0 07/04/2024 1626   GLUCOSEU NEGATIVE 07/04/2024 1626   HGBUR NEGATIVE 07/04/2024 1626   BILIRUBINUR NEGATIVE 07/04/2024 1626   KETONESUR NEGATIVE 07/04/2024 1626   PROTEINUR NEGATIVE 07/04/2024 1626   NITRITE NEGATIVE 07/04/2024 1626   LEUKOCYTESUR NEGATIVE 07/04/2024 1626   Sepsis Labs: @LABRCNTIP (procalcitonin:4,lacticidven:4) ) Recent Results (from the past 240 hours)  Resp panel by RT-PCR (RSV, Flu A&B, Covid) Anterior Nasal Swab     Status: None   Collection Time: 08/20/24  4:50 PM   Specimen: Anterior Nasal Swab  Result Value Ref Range Status   SARS Coronavirus 2 by RT PCR NEGATIVE NEGATIVE Final    Comment: (NOTE) SARS-CoV-2 target nucleic acids are NOT DETECTED.  The SARS-CoV-2 RNA is generally detectable in upper respiratory specimens during the acute phase of infection. The lowest concentration of SARS-CoV-2 viral copies this assay can  detect is 138 copies/mL. A negative result does not preclude SARS-Cov-2 infection and should not be used as the sole basis for treatment or other patient management decisions. A negative result may occur with  improper specimen collection/handling, submission of specimen other than nasopharyngeal swab, presence of viral mutation(s) within the areas targeted by this assay, and inadequate number of viral copies(<138 copies/mL). A negative result must be combined with clinical observations, patient history, and epidemiological information. The expected result is Negative.  Fact Sheet for Patients:  bloggercourse.com  Fact Sheet for Healthcare Providers:  seriousbroker.it  This test is no t yet approved or cleared by the [place]  FDA and  has been authorized for detection and/or diagnosis of  SARS-CoV-2 by FDA under an Emergency Use Authorization (EUA). This EUA will remain  in effect (meaning this test can be used) for the duration of the COVID-19 declaration under Section 564(b)(1) of the Act, 21 U.S.C.section 360bbb-3(b)(1), unless the authorization is terminated  or revoked sooner.       Influenza A by PCR NEGATIVE NEGATIVE Final   Influenza B by PCR NEGATIVE NEGATIVE Final    Comment: (NOTE) The Xpert Xpress SARS-CoV-2/FLU/RSV plus assay is intended as an aid in the diagnosis of influenza from Nasopharyngeal swab specimens and should not be used as a sole basis for treatment. Nasal washings and aspirates are unacceptable for Xpert Xpress SARS-CoV-2/FLU/RSV testing.  Fact Sheet for Patients: bloggercourse.com  Fact Sheet for Healthcare Providers: seriousbroker.it  This test is not yet approved or cleared by the [place]  FDA and has been authorized for detection and/or diagnosis of SARS-CoV-2 by FDA under an Emergency Use Authorization (EUA). This EUA will remain in effect (meaning this test can be used) for the duration of the COVID-19 declaration under Section 564(b)(1) of the Act, 21 U.S.C. section 360bbb-3(b)(1), unless the authorization is terminated or revoked.     Resp Syncytial Virus by PCR NEGATIVE NEGATIVE Final    Comment: (NOTE) Fact Sheet for Patients: bloggercourse.com  Fact Sheet for Healthcare Providers: seriousbroker.it  This test is not yet approved or cleared by the [place]  FDA and has been authorized for detection and/or diagnosis of SARS-CoV-2 by FDA under an Emergency Use Authorization (EUA). This EUA will remain in effect (meaning this test can be used) for the duration of the COVID-19 declaration under Section 564(b)(1) of the Act, 21 U.S.C. section 360bbb-3(b)(1), unless the authorization is terminated  or revoked.  Performed at [facility] [address], [place], [place] [address]      Radiological Exams on Admission:   Assessment/Plan Principal Problem:   Multifocal pneumonia Active Problems:   Acute respiratory failure with hypoxia (HCC)   Sepsis (HCC)   AKI (acute kidney injury)   Abnormal LFTs   Tobacco abuse   Overweight (BMI 25.0-29.9)   Assessment and Plan:   Acute respiratory failure with hypoxia and sepsis due to Multifocal pneumonia:  pt has 3 L new oxygen requirement.  Patient meets criteria for sepsis with fever of 103.1 and tachycardia with heart rate 115.  Lactic acid normal at 0.9.  CT negative for PE, but showed multifocal infiltration.  Patient does not have leukocytosis, suspecting viral infection.  PCR negative for COVID, flu and RSV.  Will check RVP.  Patient has bronchospasm with wheezing on auscultation.  -will admit patient to PCU as inpt  -Bronchodilators and prn Mucinex  -Solu-Medrol  80 mg IV daily after given 125 mg of Solu-Medrol  (pt also received 60 mg of prednisone  in  ED) - IV Rocephin  and azithromycin  empirically - Incentive spirometry - check RVP - Follow up sputum culture - Urine legionella and S. pneumococcal antigen - Follow up blood culture x2, sputum culture - IVF: 1L of NS bolus in ED, followed by 75 mL per hour of NS   AKI (acute kidney injury): Creatinine is 1.68, BUN 12, GFR 53.  Patient had creatinine 1.30 on 01/11/2020 with GFR> 60.  No recent renal function data available.  Likely due to dehydration. - IV fluid as above - Avoid using renal toxic medications (patient received 800 mg of ibuprofen  in ED, will not continue NSAIDs).  Abnormal LFTs: Likely due to sepsis. -Check hepatitis panel - Judicious use as needed Tylenol  (difficult situation, cannot use NSAIDs due to AKI)  Tobacco abuse -Did counseling about importance of quitting smoking - Nicotine  patch  Overweight (BMI 25.0-29.9): Body weight 81.6 kg, BMI  28.19 - Encourage losing weight - Exercise and healthy diet      DVT ppx: SQ Lovenox   Code Status: Full code   Family Communication:     not done, no family member is at bed side.     Disposition Plan:  Anticipate discharge back to previous environment  Consults called:  none  Admission status and Level of care: Progressive:   as inpt        Dispo: The patient is from: Home              Anticipated d/c is to: Home              Anticipated d/c date is: 2 days              Patient currently is not medically stable to d/c.    Severity of Illness:  The appropriate patient status for this patient is INPATIENT. Inpatient status is judged to be reasonable and necessary in order to provide the required intensity of service to ensure the patient's safety. The patient's presenting symptoms, physical exam findings, and initial radiographic and laboratory data in the context of their chronic comorbidities is felt to place them at high risk for further clinical deterioration. Furthermore, it is not anticipated that the patient will be medically stable for discharge from the hospital within 2 midnights of admission.   * I certify that at the point of admission it is my clinical judgment that the patient will require inpatient hospital care spanning beyond 2 midnights from the point of admission due to high intensity of service, high risk for further deterioration and high frequency of surveillance required.*       Date of Service 08/21/2024    Caleb Exon [facility]   If 7PM-7AM, please contact night-coverage [email or web address] 08/21/2024, 12:54 AM     [1] No Known Allergies  "

## 2024-08-21 NOTE — ED Provider Notes (Signed)
 Clinical Course as of 08/21/24 0019  Tue Aug 20, 2024  2351 Resigned signout: Presented today with flu like symptoms. No history of asthma/breathing problems. On O2. [ ]  CTA and admisison.  [HD]  Wed Aug 21, 2024  0005 CT Angio Chest PE W and/or Wo Contrast Consistent with multifocal pneumonia will initiate blood cultures antibiotics [HD]  0018 Patient reassessed, still having some bronchospasm.  Advised him of need for admission given the ongoing oxygen and his CT findings of pneumonia.  Related to stay.  Case called into hospitalist for admission [HD]    Clinical Course User Index [CT] Herlinda Kirk NOVAK, FNP [HD] Nicholaus Rolland BRAVO, MD   .Critical Care  Performed by: Nicholaus Rolland BRAVO, MD Authorized by: Nicholaus Rolland BRAVO, MD   Critical care provider statement:    Critical care time (minutes):  30   Critical care was necessary to treat or prevent imminent or life-threatening deterioration of the following conditions:  Respiratory failure   Critical care was time spent personally by me on the following activities:  Development of treatment plan with patient or surrogate, discussions with consultants, evaluation of patient's response to treatment, examination of patient, ordering and review of laboratory studies, ordering and review of radiographic studies, ordering and performing treatments and interventions, pulse oximetry, re-evaluation of patient's condition and review of old charts Comments:     Requiring 3 or more nebulizing treatments and reassessments along with broad-spectrum antibiotics for pneumonia     Nicholaus Rolland BRAVO, MD 08/21/24 [phone]

## 2024-08-23 LAB — LEGIONELLA PNEUMOPHILA SEROGP 1 UR AG: L. pneumophila Serogp 1 Ur Ag: NEGATIVE

## 2024-08-26 LAB — CULTURE, BLOOD (ROUTINE X 2)
Culture: NO GROWTH
Culture: NO GROWTH
Special Requests: ADEQUATE

## 2024-09-11 ENCOUNTER — Other Ambulatory Visit: Payer: Self-pay

## 2024-09-11 ENCOUNTER — Emergency Department
Admission: EM | Admit: 2024-09-11 | Discharge: 2024-09-11 | Disposition: A | Discharge status: Home / Self Care | Payer: Self-pay | Attending: Emergency Medicine | Admitting: Emergency Medicine

## 2024-09-11 ENCOUNTER — Encounter: Payer: Self-pay | Admitting: *Deleted

## 2024-09-11 DIAGNOSIS — M545 Low back pain, unspecified: Secondary | ICD-10-CM | POA: Insufficient documentation

## 2024-09-11 DIAGNOSIS — M549 Dorsalgia, unspecified: Secondary | ICD-10-CM

## 2024-09-11 MED ORDER — BACLOFEN 10 MG PO TABS: 10.0000 mg | ORAL_TABLET | Freq: Three times a day (TID) | ORAL | 0 refills | Status: AC

## 2024-09-11 MED ORDER — PREDNISONE 10 MG (21) PO TBPK: ORAL_TABLET | ORAL | 0 refills | Status: AC

## 2024-09-11 NOTE — ED Provider Notes (Signed)
 "  [facility] Provider Note    Event Date/Time   First MD Initiated Contact with Patient 09/11/24 1645     (approximate)   History   Back Pain   HPI  Charles Montes is a 39 y.o. male with history of low back pain presents emergency department complaining of continued low back pain.  States hurting along the lower back, worse with movement.  No new injury.  No numbness or tingling.  No loss of bowel or bladder control.      Physical Exam   Triage Vital Signs: ED Triage Vitals  Encounter Vitals Group     BP 09/11/24 1639 135/79     Girls Systolic BP Percentile --      Girls Diastolic BP Percentile --      Boys Systolic BP Percentile --      Boys Diastolic BP Percentile --      Pulse Rate 09/11/24 1639 88     Resp 09/11/24 1639 17     Temp 09/11/24 1639 98.9 F (37.2 C)     Temp Source 09/11/24 1639 Oral     SpO2 09/11/24 1639 100 %     Weight 09/11/24 1643 175 lb (79.4 kg)     Height 09/11/24 1643 5' 7 (1.702 m)     Head Circumference --      Peak Flow --      Pain Score 09/11/24 1643 8     Pain Loc --      Pain Education --      Exclude from Growth Chart --     Most recent vital signs: Vitals:   09/11/24 1639 09/11/24 1643  BP: 135/79 135/79  Pulse: 88 85  Resp: 17 18  Temp: 98.9 F (37.2 C) 98.9 F (37.2 C)  SpO2: 100% 100%     General: Awake, no distress.   CV:  Good peripheral perfusion. Resp:  Normal effort.  Abd:  No distention.   Other:  Lumbar spine nontender, SI joints tender, patient is able to bend forwards and touch his toes, hyperextend his back, walks without difficulty neurovascular intact   ED Results / Procedures / Treatments   Labs (all labs ordered are listed, but only abnormal results are displayed) Labs Reviewed - No data to display   EKG     RADIOLOGY     PROCEDURES:   Procedures  Critical Care:  no Chief Complaint  Patient presents with   Back Pain      MEDICATIONS  ORDERED IN ED: Medications - No data to display   IMPRESSION / MDM / ASSESSMENT AND PLAN / ED COURSE  I reviewed the triage vital signs and the nursing notes.                              Differential diagnosis includes, but is not limited to, chronic back pain, lumbar strain, cauda equina  Patient's presentation is most consistent with acute illness / injury with system symptoms.   Patient's exam is benign.  This seems to be a chronic problem.  No signs of cauda equina noted on exam.  At this time I think we can treat him with steroids and muscle relaxer.  He has been taking Tylenol  and ibuprofen  at home.  Given a work note for today and tomorrow at his request.  Patient was discharged stable condition.  Actions to follow-up with orthopedics if not better in  1 week        FINAL CLINICAL IMPRESSION(S) / ED DIAGNOSES   Final diagnoses:  Acute on chronic back pain     Rx / DC Orders   ED Discharge Orders          Ordered    predniSONE  (STERAPRED UNI-PAK 21 TAB) 10 MG (21) TBPK tablet        09/11/24 1647    baclofen  (LIORESAL ) 10 MG tablet  3 times daily        09/11/24 1647             Note:  This document was prepared using Dragon voice recognition software and may include unintentional dictation errors.    Gasper Devere ORN, PA-C 09/11/24 1918    Floy Roberts, MD 09/11/24 1938  "

## 2024-09-11 NOTE — ED Triage Notes (Signed)
 Pt ambulatory to triage.  Pt has lower back pain    denies urinary sx.  No n/v/d   no injury to back  hx sciatica  pt alert
